# Patient Record
Sex: Female | Born: 1970 | Race: Black or African American | Hispanic: No | Marital: Married | Smoking: Never smoker
Health system: Southern US, Community
[De-identification: ages and names within clinical notes are randomized; demographics above are authoritative.]

---

## 1990-05-08 HISTORY — PX: CHOLECYSTECTOMY: SHX55

## 1997-12-02 ENCOUNTER — Other Ambulatory Visit: Admission: RE | Admit: 1997-12-02 | Discharge: 1997-12-02 | Payer: Self-pay | Admitting: Family Medicine

## 1999-12-23 ENCOUNTER — Other Ambulatory Visit: Admission: RE | Admit: 1999-12-23 | Discharge: 1999-12-23 | Payer: Self-pay | Admitting: Family Medicine

## 2003-07-23 ENCOUNTER — Other Ambulatory Visit: Admission: RE | Admit: 2003-07-23 | Discharge: 2003-07-23 | Payer: Self-pay | Admitting: Family Medicine

## 2004-04-14 ENCOUNTER — Emergency Department (HOSPITAL_COMMUNITY): Admission: EM | Admit: 2004-04-14 | Discharge: 2004-04-14 | Payer: Self-pay | Admitting: Emergency Medicine
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
## 2004-09-01 ENCOUNTER — Other Ambulatory Visit: Admission: RE | Admit: 2004-09-01 | Discharge: 2004-09-01 | Payer: Self-pay | Admitting: Family Medicine

## 2006-05-10 ENCOUNTER — Other Ambulatory Visit: Admission: RE | Admit: 2006-05-10 | Discharge: 2006-05-10 | Payer: Self-pay | Admitting: Family Medicine

## 2006-09-22 ENCOUNTER — Emergency Department (HOSPITAL_COMMUNITY): Admission: EM | Admit: 2006-09-22 | Discharge: 2006-09-23 | Payer: Self-pay | Admitting: Emergency Medicine

## 2007-08-01 ENCOUNTER — Other Ambulatory Visit: Admission: RE | Admit: 2007-08-01 | Discharge: 2007-08-01 | Payer: Self-pay | Admitting: Family Medicine

## 2008-08-11 ENCOUNTER — Other Ambulatory Visit: Admission: RE | Admit: 2008-08-11 | Discharge: 2008-08-11 | Payer: Self-pay | Admitting: Family Medicine

## 2009-05-08 HISTORY — PX: KNEE CARTILAGE SURGERY: SHX688

## 2009-08-16 ENCOUNTER — Other Ambulatory Visit: Admission: RE | Admit: 2009-08-16 | Discharge: 2009-08-16 | Payer: Self-pay | Admitting: Family Medicine

## 2010-08-25 ENCOUNTER — Other Ambulatory Visit: Payer: Self-pay | Admitting: Physician Assistant

## 2010-08-25 ENCOUNTER — Other Ambulatory Visit (HOSPITAL_COMMUNITY)
Admission: RE | Admit: 2010-08-25 | Discharge: 2010-08-25 | Disposition: A | Discharge status: Home / Self Care | Payer: BC Managed Care – PPO | Source: Ambulatory Visit | Attending: Family Medicine | Admitting: Family Medicine

## 2010-08-25 DIAGNOSIS — Z01419 Encounter for gynecological examination (general) (routine) without abnormal findings: Secondary | ICD-10-CM | POA: Insufficient documentation

## 2010-12-13 ENCOUNTER — Ambulatory Visit (HOSPITAL_BASED_OUTPATIENT_CLINIC_OR_DEPARTMENT_OTHER)
Admission: RE | Admit: 2010-12-13 | Discharge: 2010-12-13 | Disposition: A | Discharge status: Home / Self Care | Payer: BC Managed Care – PPO | Source: Ambulatory Visit | Attending: Orthopaedic Surgery | Admitting: Orthopaedic Surgery

## 2010-12-13 DIAGNOSIS — Z01812 Encounter for preprocedural laboratory examination: Secondary | ICD-10-CM | POA: Insufficient documentation

## 2010-12-13 DIAGNOSIS — M2240 Chondromalacia patellae, unspecified knee: Secondary | ICD-10-CM | POA: Insufficient documentation

## 2010-12-13 DIAGNOSIS — M23329 Other meniscus derangements, posterior horn of medial meniscus, unspecified knee: Secondary | ICD-10-CM | POA: Insufficient documentation

## 2010-12-21 NOTE — Op Note (Signed)
  Destiny Andrews, Destiny Andrews                 ACCOUNT NO.:  [id number]  MEDICAL RECORD NO.:  [id number]  LOCATION:                                 FACILITY:  PHYSICIAN:  Lubertha Basque. Constanza Mincy, M.D.DATE OF BIRTH:  Oct 22, 1970  DATE OF PROCEDURE:  12/13/2010 DATE OF DISCHARGE:                              OPERATIVE REPORT   PREOPERATIVE DIAGNOSES: 1. Left knee torn medial meniscus. 2. Left knee chondromalacia patella.  POSTOPERATIVE DIAGNOSES: 1. Left knee torn medial meniscus. 2. Left knee chondromalacia patella.  PROCEDURES: 1. Left knee partial medial meniscectomy. 2. Left knee abrasion chondroplasty of patellofemoral.  ANESTHESIA:  General.  ATTENDING SURGEON:  Lubertha Basque. Jerl Santos, MD  ASSISTANT:  Lindwood Qua, PA   INDICATIONS FOR PROCEDURE:  The patient is a 40 year old woman with many months of left knee pain.  This has persisted despite conservative measures.  She has an MRI scan which shows a fairly large radial tear at the posterior horn of the medial meniscus.  She is offered arthroscopy in hopes of improving her situation.  Informed operative consent was obtained after discussion of possible complications including reaction to anesthesia and infection.  SUMMARY OF FINDINGS AND PROCEDURE:  Under general anesthesia, an arthroscopy of the left knee was performed.  Suprapatellar pouch was benign while the patellofemoral joint exhibited some focal breakdown at the apex of the patella.  A thorough chondroplasty was done along with abrasion to bleeding bone in one tiny area.  Patella did track in a relatively lateral position, but no lateral release was felt to be indicated.  The medial compartment was notable for posterior horn medial meniscus tear, radial in nature out to the periphery.  I contoured this back to stable tissues in both directions.  About 10% partial medial menisectomy was done.  There were minimal degenerative changes in this compartment.  ACL was intact and  the lateral compartment was completely benign.  DESCRIPTION OF PROCEDURE:  The patient was taken to the operating suite where general anesthetic was applied without difficulty.  She was positioned in supine and prepped and draped in normal sterile fashion. After administration of IV Kefzol, an arthroscopy of the left knee was formed through a total of 2 portals.  Findings were as noted above and the procedure consisted of partial medial meniscectomy done with basket and shaver back to stable tissues.  We then performed the abrasion chondroplasty patellofemoral.  The knee was thoroughly irrigated followed by placement of the Marcaine with epinephrine and morphine. Adaptic was placed over the portals followed by dry gauze and a loose Ace wrap.  Estimated blood loss and fluids obtained from anesthesia records.  DISPOSITION:  The patient was explained in the operating room and taken to recovery room in stable condition.  She was to go home the same day and follow up in the office closely.  I will contact her by phone tonight.     Lubertha Basque Jerl Santos, M.D.     PGD/MEDQ  D:  12/13/2010  T:  12/14/2010  Job:  [id number]  Electronically Signed by Marcene Corning M.D. on 12/21/2010 08:59:39 PM

## 2011-08-25 ENCOUNTER — Other Ambulatory Visit: Payer: Self-pay | Admitting: Physician Assistant

## 2011-08-25 ENCOUNTER — Other Ambulatory Visit (HOSPITAL_COMMUNITY)
Admission: RE | Admit: 2011-08-25 | Discharge: 2011-08-25 | Disposition: A | Discharge status: Home / Self Care | Payer: BC Managed Care – PPO | Source: Ambulatory Visit | Attending: Family Medicine | Admitting: Family Medicine

## 2011-08-25 DIAGNOSIS — Z124 Encounter for screening for malignant neoplasm of cervix: Secondary | ICD-10-CM | POA: Insufficient documentation

## 2011-08-28 ENCOUNTER — Other Ambulatory Visit: Payer: Self-pay | Admitting: Family Medicine

## 2011-08-28 DIAGNOSIS — Z1231 Encounter for screening mammogram for malignant neoplasm of breast: Secondary | ICD-10-CM

## 2011-08-29 ENCOUNTER — Ambulatory Visit
Admission: RE | Admit: 2011-08-29 | Discharge: 2011-08-29 | Disposition: A | Discharge status: Home / Self Care | Payer: BC Managed Care – PPO | Source: Ambulatory Visit | Attending: Family Medicine | Admitting: Family Medicine

## 2011-08-29 DIAGNOSIS — Z1231 Encounter for screening mammogram for malignant neoplasm of breast: Secondary | ICD-10-CM

## 2012-08-15 ENCOUNTER — Other Ambulatory Visit (HOSPITAL_COMMUNITY)
Admission: RE | Admit: 2012-08-15 | Discharge: 2012-08-15 | Disposition: A | Discharge status: Home / Self Care | Payer: BC Managed Care – PPO | Source: Ambulatory Visit | Attending: Family Medicine | Admitting: Family Medicine

## 2012-08-15 ENCOUNTER — Other Ambulatory Visit: Payer: Self-pay | Admitting: Physician Assistant

## 2012-08-15 DIAGNOSIS — Z124 Encounter for screening for malignant neoplasm of cervix: Secondary | ICD-10-CM | POA: Insufficient documentation

## 2012-08-19 ENCOUNTER — Other Ambulatory Visit: Payer: Self-pay

## 2012-08-19 DIAGNOSIS — Z1231 Encounter for screening mammogram for malignant neoplasm of breast: Secondary | ICD-10-CM

## 2012-09-06 ENCOUNTER — Ambulatory Visit
Admission: RE | Admit: 2012-09-06 | Discharge: 2012-09-06 | Disposition: A | Discharge status: Home / Self Care | Payer: BC Managed Care – PPO | Source: Ambulatory Visit

## 2012-09-06 DIAGNOSIS — Z1231 Encounter for screening mammogram for malignant neoplasm of breast: Secondary | ICD-10-CM

## 2013-08-28 ENCOUNTER — Other Ambulatory Visit: Payer: Self-pay | Admitting: Physician Assistant

## 2013-08-28 ENCOUNTER — Other Ambulatory Visit (HOSPITAL_COMMUNITY)
Admission: RE | Admit: 2013-08-28 | Discharge: 2013-08-28 | Disposition: A | Discharge status: Home / Self Care | Payer: BC Managed Care – PPO | Source: Ambulatory Visit | Attending: Family Medicine | Admitting: Family Medicine

## 2013-08-28 DIAGNOSIS — Z124 Encounter for screening for malignant neoplasm of cervix: Secondary | ICD-10-CM | POA: Insufficient documentation

## 2013-09-09 ENCOUNTER — Other Ambulatory Visit: Payer: Self-pay

## 2013-09-09 DIAGNOSIS — Z1231 Encounter for screening mammogram for malignant neoplasm of breast: Secondary | ICD-10-CM

## 2013-09-17 ENCOUNTER — Ambulatory Visit
Admission: RE | Admit: 2013-09-17 | Discharge: 2013-09-17 | Disposition: A | Discharge status: Home / Self Care | Payer: BC Managed Care – PPO | Source: Ambulatory Visit

## 2013-09-17 ENCOUNTER — Encounter (INDEPENDENT_AMBULATORY_CARE_PROVIDER_SITE_OTHER): Payer: Self-pay

## 2013-09-17 DIAGNOSIS — Z1231 Encounter for screening mammogram for malignant neoplasm of breast: Secondary | ICD-10-CM

## 2014-08-28 ENCOUNTER — Other Ambulatory Visit: Payer: Self-pay

## 2014-08-28 DIAGNOSIS — Z1231 Encounter for screening mammogram for malignant neoplasm of breast: Secondary | ICD-10-CM

## 2014-09-22 ENCOUNTER — Ambulatory Visit: Payer: BC Managed Care – PPO

## 2014-09-25 ENCOUNTER — Ambulatory Visit
Admission: RE | Admit: 2014-09-25 | Discharge: 2014-09-25 | Disposition: A | Discharge status: Home / Self Care | Payer: BC Managed Care – PPO | Source: Ambulatory Visit

## 2014-09-25 ENCOUNTER — Encounter (INDEPENDENT_AMBULATORY_CARE_PROVIDER_SITE_OTHER): Payer: Self-pay

## 2014-09-25 DIAGNOSIS — Z1231 Encounter for screening mammogram for malignant neoplasm of breast: Secondary | ICD-10-CM

## 2015-05-03 ENCOUNTER — Ambulatory Visit (INDEPENDENT_AMBULATORY_CARE_PROVIDER_SITE_OTHER): Payer: BC Managed Care – PPO

## 2015-05-03 ENCOUNTER — Ambulatory Visit (INDEPENDENT_AMBULATORY_CARE_PROVIDER_SITE_OTHER): Payer: BC Managed Care – PPO | Admitting: Podiatry

## 2015-05-03 DIAGNOSIS — M2040 Other hammer toe(s) (acquired), unspecified foot: Secondary | ICD-10-CM | POA: Diagnosis not present

## 2015-05-03 DIAGNOSIS — M79675 Pain in left toe(s): Secondary | ICD-10-CM | POA: Diagnosis not present

## 2015-05-03 NOTE — Progress Notes (Signed)
Subjective:     Patient ID: Destiny Andrews, female   DOB: February 19, 1971, 44 y.o.   MRN: [id number]  HPI patient presents stating I have corns on each of my fifth toes left worse than right and they bother me at time and the color has changed when I put on medication   Review of Systems  All other systems reviewed and are negative.      Objective:   Physical Exam  Constitutional: She is oriented to person, place, and time.  Cardiovascular: Intact distal pulses.   Musculoskeletal: Normal range of motion.  Neurological: She is oriented to person, place, and time.  Skin: Skin is warm.  Nursing note and vitals reviewed.  neurovascular status intact muscle strength adequate range of motion within normal limits with patient noted to have keratotic lesions fifth digits bilateral left over right that are painful when pressed with discoloration on the head of the proximal phalanx fifth toe left. Patient has good digital perfusion and is well oriented 3     Assessment:     Hammertoe deformity fifth digit bilateral with keratotic lesion and moderate discomfort    Plan:     H&P and x-rays reviewed with patient. I discussed possibilities for future arthroplasty or other procedure and at this point I went ahead and I discussed padding therapy which was dispensed. Reappoint as symptoms indicate

## 2015-05-03 NOTE — Progress Notes (Signed)
   Subjective:    Patient ID: Destiny Andrews, female    DOB: 1971-03-09, 44 y.o.   MRN: [id number]  HPI Pt presents with bilateral 5th toe corns, left over right, painful recent complication   Review of Systems  All other systems reviewed and are negative.      Objective:   Physical Exam        Assessment & Plan:

## 2015-05-04 DIAGNOSIS — M2040 Other hammer toe(s) (acquired), unspecified foot: Secondary | ICD-10-CM

## 2015-09-06 ENCOUNTER — Other Ambulatory Visit: Payer: Self-pay | Admitting: Physician Assistant

## 2015-09-06 ENCOUNTER — Other Ambulatory Visit (HOSPITAL_COMMUNITY)
Admission: RE | Admit: 2015-09-06 | Discharge: 2015-09-06 | Disposition: A | Discharge status: Home / Self Care | Payer: BC Managed Care – PPO | Source: Ambulatory Visit | Attending: Family Medicine | Admitting: Family Medicine

## 2015-09-06 DIAGNOSIS — Z124 Encounter for screening for malignant neoplasm of cervix: Secondary | ICD-10-CM | POA: Insufficient documentation

## 2015-09-07 LAB — CYTOLOGY - PAP

## 2015-09-21 ENCOUNTER — Other Ambulatory Visit: Payer: Self-pay

## 2015-09-21 DIAGNOSIS — Z1231 Encounter for screening mammogram for malignant neoplasm of breast: Secondary | ICD-10-CM

## 2015-09-27 ENCOUNTER — Ambulatory Visit
Admission: RE | Admit: 2015-09-27 | Discharge: 2015-09-27 | Disposition: A | Discharge status: Home / Self Care | Payer: BC Managed Care – PPO | Source: Ambulatory Visit

## 2015-09-27 DIAGNOSIS — Z1231 Encounter for screening mammogram for malignant neoplasm of breast: Secondary | ICD-10-CM

## 2016-08-24 ENCOUNTER — Other Ambulatory Visit: Payer: Self-pay | Admitting: Physician Assistant

## 2016-08-24 DIAGNOSIS — Z1231 Encounter for screening mammogram for malignant neoplasm of breast: Secondary | ICD-10-CM

## 2016-09-27 ENCOUNTER — Inpatient Hospital Stay: Admission: RE | Admit: 2016-09-27 | Payer: BC Managed Care – PPO | Source: Ambulatory Visit

## 2016-10-11 ENCOUNTER — Ambulatory Visit
Admission: RE | Admit: 2016-10-11 | Discharge: 2016-10-11 | Disposition: A | Discharge status: Home / Self Care | Payer: BC Managed Care – PPO | Source: Ambulatory Visit | Attending: Physician Assistant | Admitting: Physician Assistant

## 2016-10-11 DIAGNOSIS — Z1231 Encounter for screening mammogram for malignant neoplasm of breast: Secondary | ICD-10-CM

## 2017-05-08 DIAGNOSIS — I10 Essential (primary) hypertension: Secondary | ICD-10-CM

## 2017-05-08 DIAGNOSIS — E119 Type 2 diabetes mellitus without complications: Secondary | ICD-10-CM

## 2017-05-08 HISTORY — DX: Type 2 diabetes mellitus without complications: E11.9

## 2017-05-08 HISTORY — DX: Essential (primary) hypertension: I10

## 2017-09-04 ENCOUNTER — Other Ambulatory Visit: Payer: Self-pay | Admitting: Physician Assistant

## 2017-09-04 DIAGNOSIS — Z1231 Encounter for screening mammogram for malignant neoplasm of breast: Secondary | ICD-10-CM

## 2017-10-12 ENCOUNTER — Ambulatory Visit
Admission: RE | Admit: 2017-10-12 | Discharge: 2017-10-12 | Disposition: A | Discharge status: Home / Self Care | Payer: BC Managed Care – PPO | Source: Ambulatory Visit | Attending: Physician Assistant | Admitting: Physician Assistant

## 2017-10-12 DIAGNOSIS — Z1231 Encounter for screening mammogram for malignant neoplasm of breast: Secondary | ICD-10-CM

## 2018-09-04 ENCOUNTER — Other Ambulatory Visit: Payer: Self-pay | Admitting: Physician Assistant

## 2018-09-04 DIAGNOSIS — Z1231 Encounter for screening mammogram for malignant neoplasm of breast: Secondary | ICD-10-CM

## 2018-11-04 ENCOUNTER — Other Ambulatory Visit: Payer: Self-pay

## 2018-11-04 ENCOUNTER — Ambulatory Visit
Admission: RE | Admit: 2018-11-04 | Discharge: 2018-11-04 | Disposition: A | Discharge status: Home / Self Care | Payer: BC Managed Care – PPO | Source: Ambulatory Visit | Attending: Physician Assistant | Admitting: Physician Assistant

## 2018-11-04 DIAGNOSIS — Z1231 Encounter for screening mammogram for malignant neoplasm of breast: Secondary | ICD-10-CM

## 2018-11-04 IMAGING — MG DIGITAL SCREENING BILATERAL MAMMOGRAM WITH TOMO AND CAD
8 series · 8 of 24 positions shown · non-contrast
Comparison: Previous exam(s).

CLINICAL DATA: Screening.

EXAM:
DIGITAL SCREENING BILATERAL MAMMOGRAM WITH TOMO AND CAD

[L MLO synth-2D]
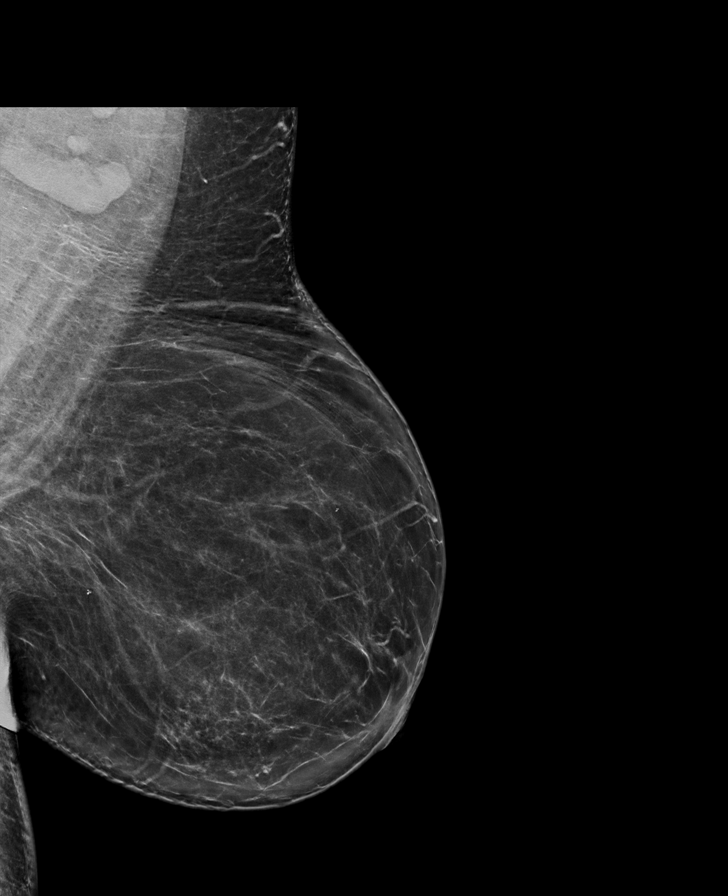

[R CC synth-2D]
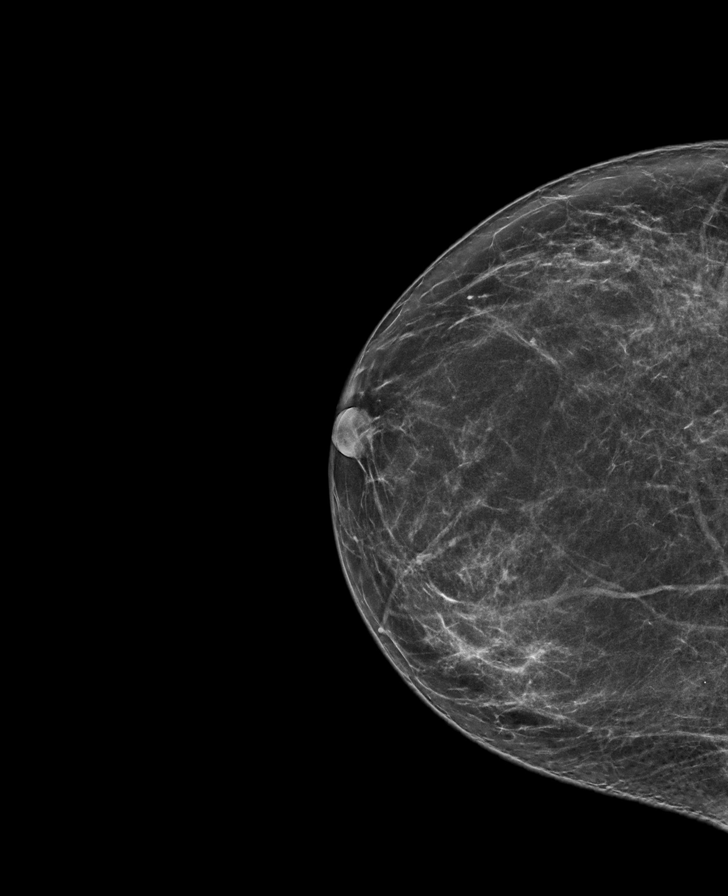

[R MLO synth-2D]
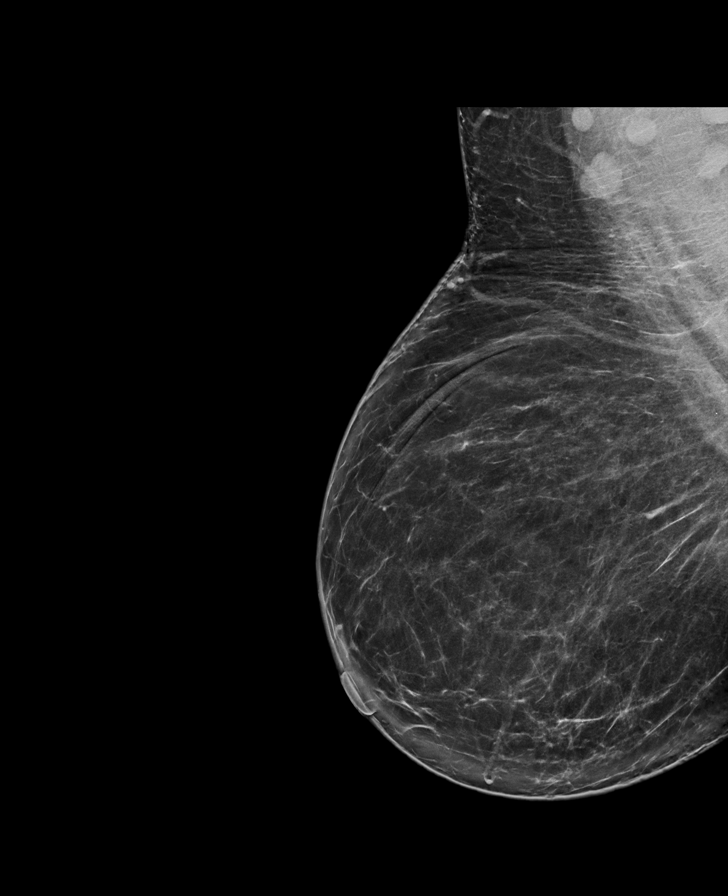

[L CC synth-2D]
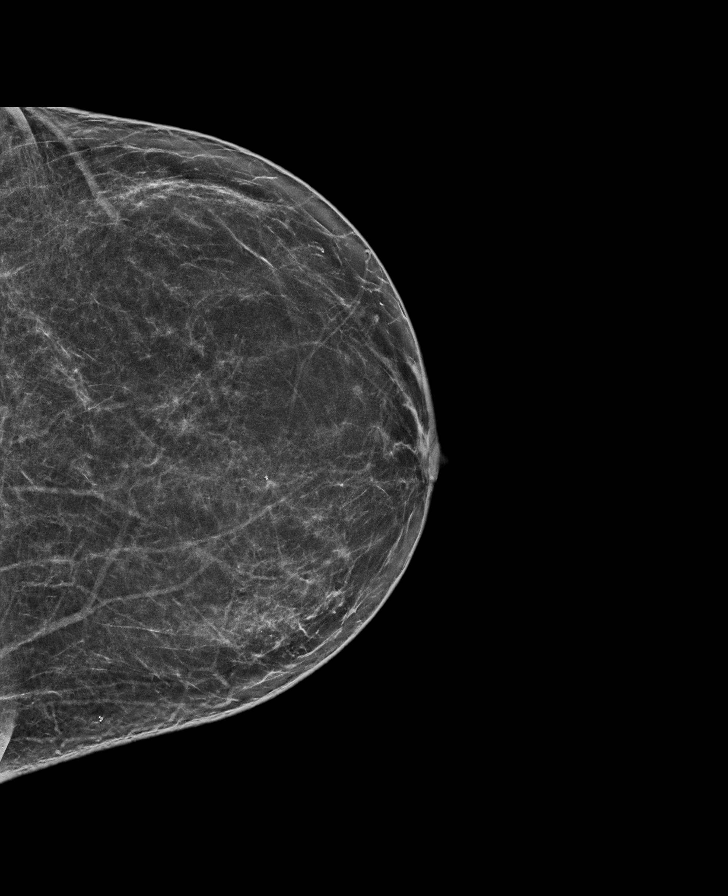

[R CC tomo · tomo slice 35/69.0]
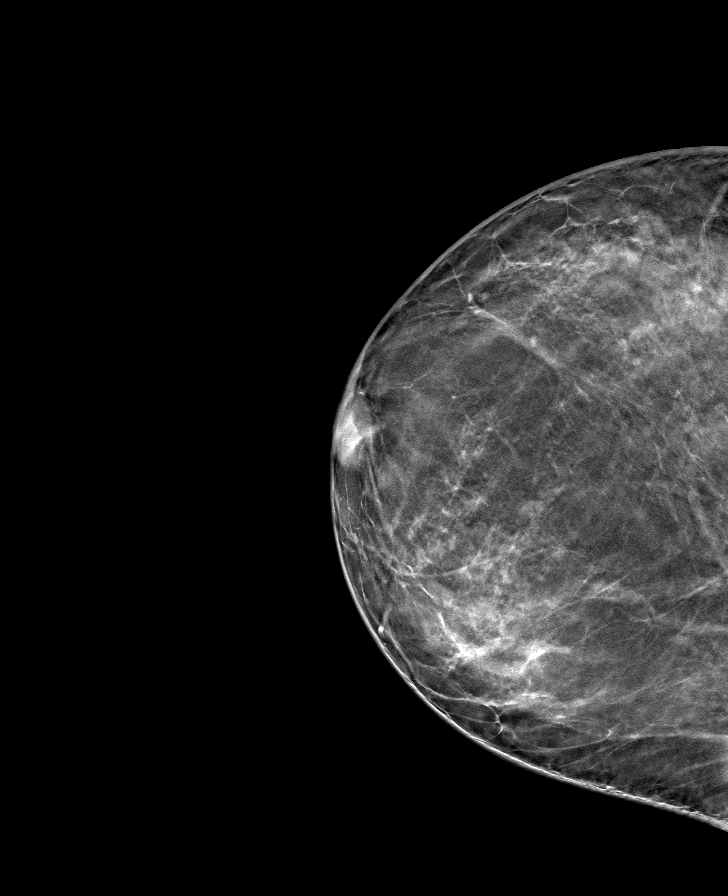

[L CC tomo · tomo slice 35/68.0]
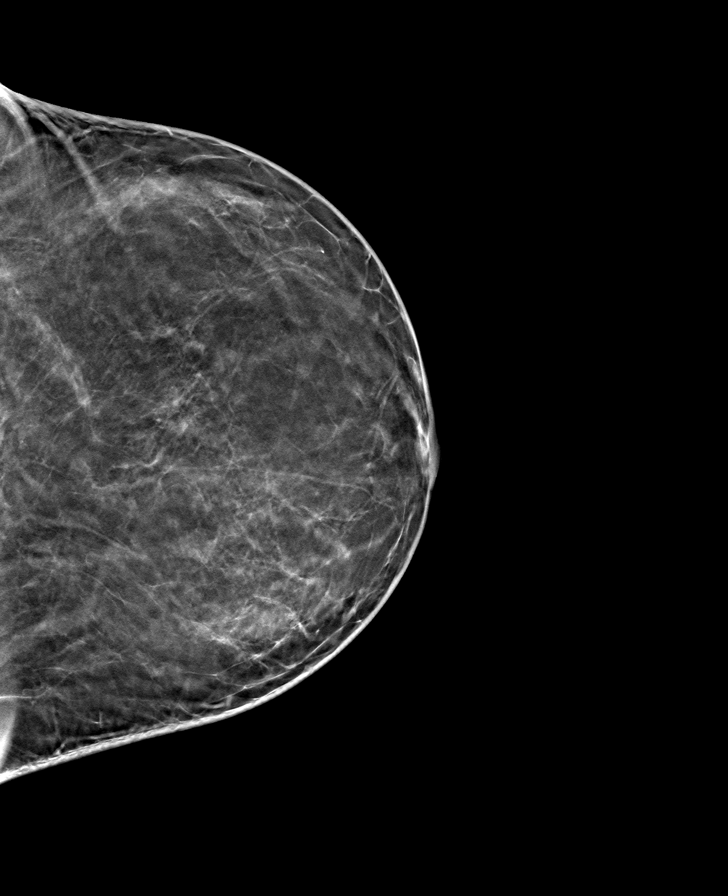

[L MLO tomo · tomo slice 47/93.0]
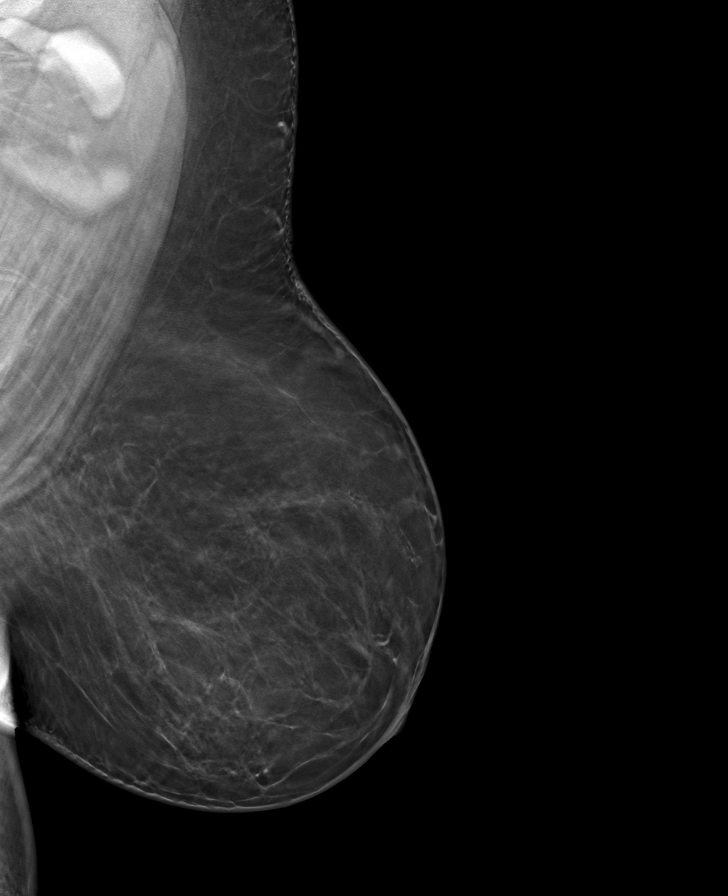

[R MLO tomo · tomo slice 45/90.0]
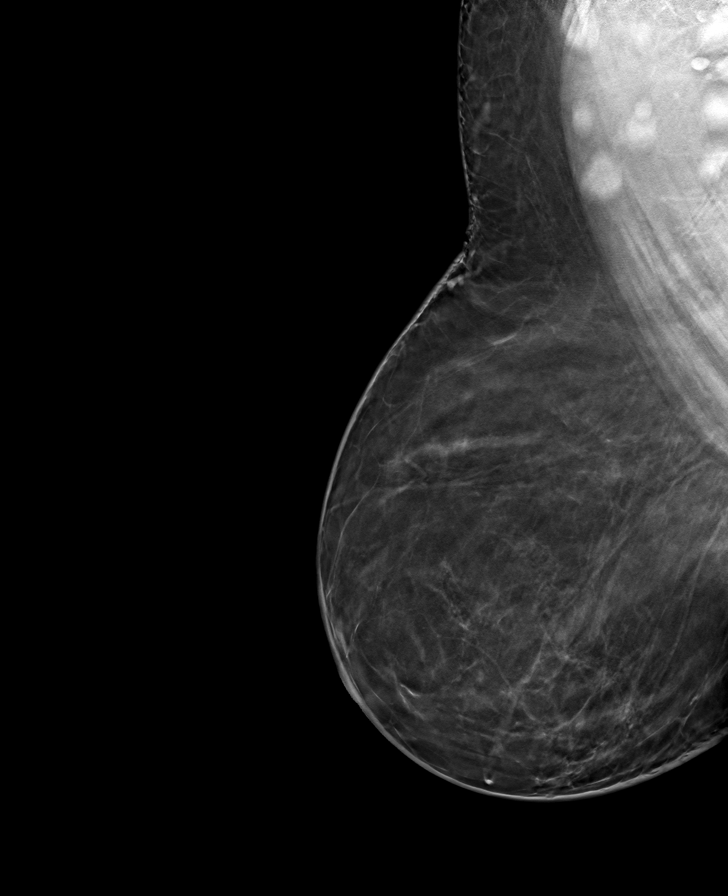

[8 of 24 positions shown; findings below may reference images not displayed]

ACR Breast Density Category b: There are scattered areas of
fibroglandular density.
FINDINGS: There are no findings suspicious for malignancy. Images were
processed with CAD.
IMPRESSION: No mammographic evidence of malignancy. A result letter of this
screening mammogram will be mailed directly to the patient.

RECOMMENDATION:
Screening mammogram in one year. (Code:[TQ])

BI-RADS CATEGORY  1: Negative.

## 2019-04-16 ENCOUNTER — Other Ambulatory Visit (HOSPITAL_COMMUNITY)
Admission: RE | Admit: 2019-04-16 | Discharge: 2019-04-16 | Disposition: A | Discharge status: Home / Self Care | Payer: BC Managed Care – PPO | Source: Ambulatory Visit | Attending: Physician Assistant | Admitting: Physician Assistant

## 2019-04-16 ENCOUNTER — Other Ambulatory Visit: Payer: Self-pay | Admitting: Physician Assistant

## 2019-04-16 DIAGNOSIS — Z124 Encounter for screening for malignant neoplasm of cervix: Secondary | ICD-10-CM | POA: Insufficient documentation

## 2019-04-17 ENCOUNTER — Other Ambulatory Visit: Payer: Self-pay | Admitting: Physician Assistant

## 2019-04-17 DIAGNOSIS — N852 Hypertrophy of uterus: Secondary | ICD-10-CM

## 2019-04-18 LAB — CYTOLOGY - PAP
Adequacy: ABSENT
Diagnosis: NEGATIVE

## 2019-04-28 ENCOUNTER — Ambulatory Visit
Admission: RE | Admit: 2019-04-28 | Discharge: 2019-04-28 | Disposition: A | Discharge status: Home / Self Care | Payer: BC Managed Care – PPO | Source: Ambulatory Visit | Attending: Physician Assistant | Admitting: Physician Assistant

## 2019-04-28 DIAGNOSIS — N852 Hypertrophy of uterus: Secondary | ICD-10-CM

## 2019-04-28 IMAGING — US US PELVIS COMPLETE WITH TRANSVAGINAL
1 series · 13 of 25 positions shown · non-contrast
Comparison: None

CLINICAL DATA: Enlarged uterus on exam; LMP [DATE]

EXAM:
TRANSABDOMINAL AND TRANSVAGINAL ULTRASOUND OF PELVIS
TECHNIQUE: Both transabdominal and transvaginal ultrasound examinations of the
pelvis were performed. Transabdominal technique was performed for
global imaging of the pelvis including uterus, ovaries, adnexal
regions, and pelvic cul-de-sac. It was necessary to proceed with
endovaginal exam following the transabdominal exam to visualize the
endometrium and ovaries.

[Series 1: us pelvis complete with transvaginal · 0.34mm/px · 13 of 122 slices shown]
[im 1/122]
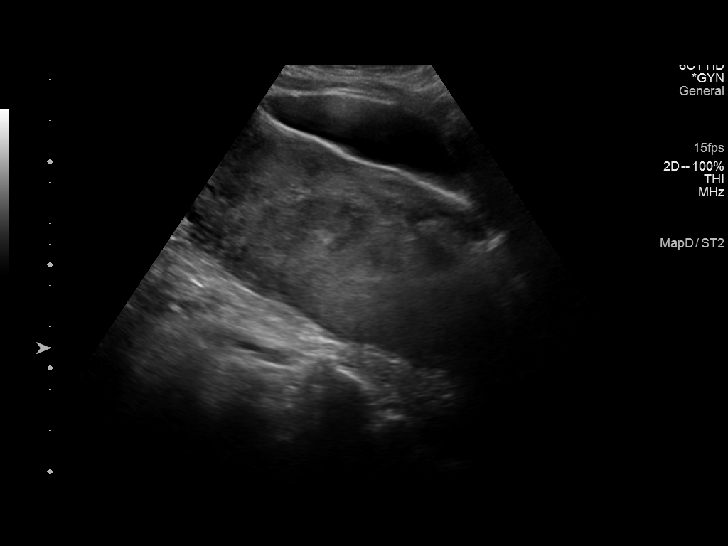
[im 11/122]
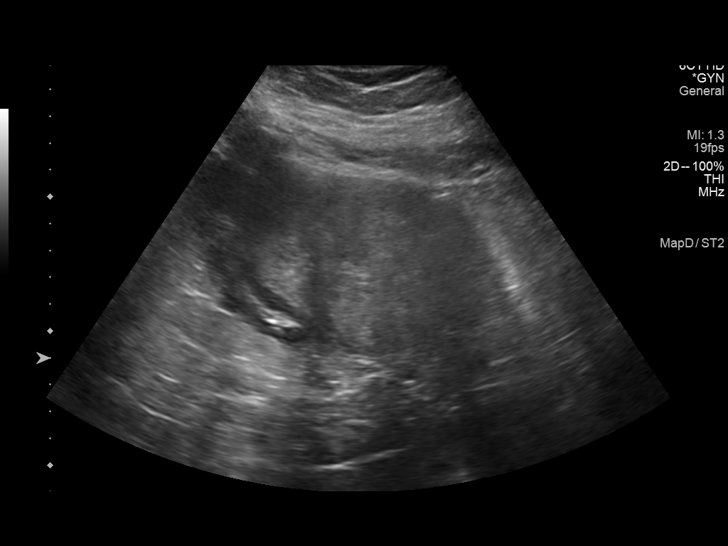
[im 21/122]
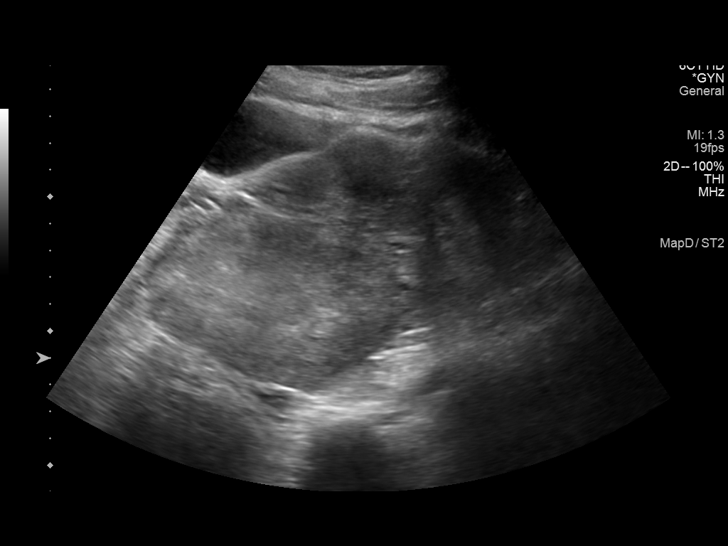
[im 31/122]
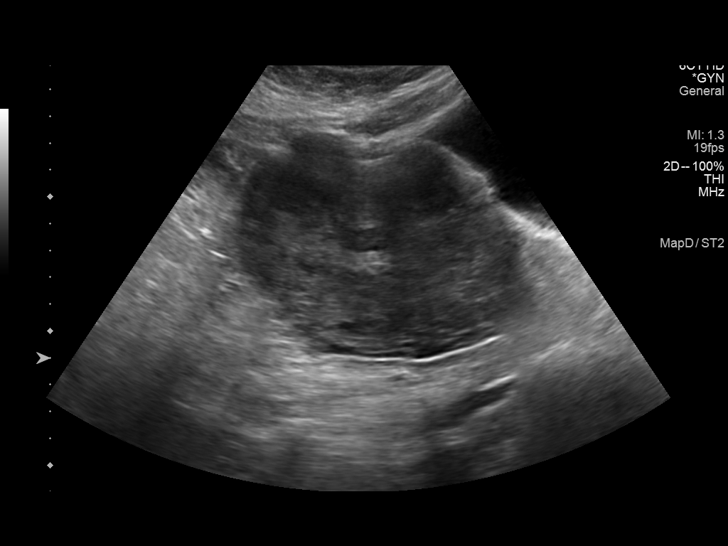
[im 41/122]
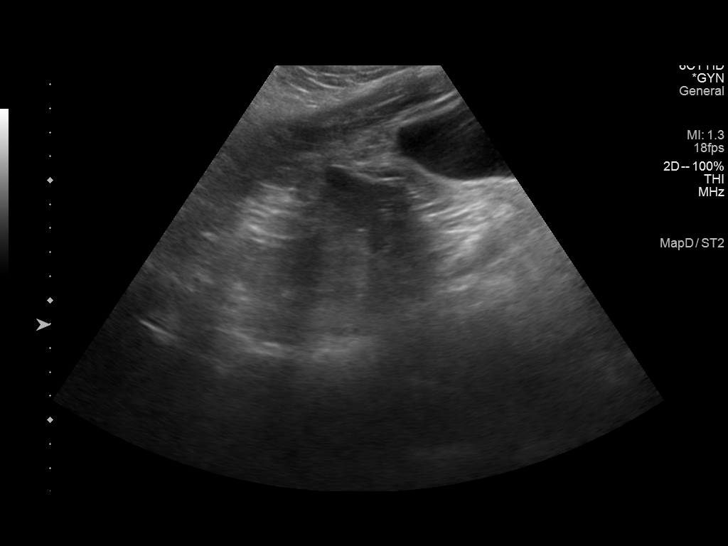
[im 51/122]
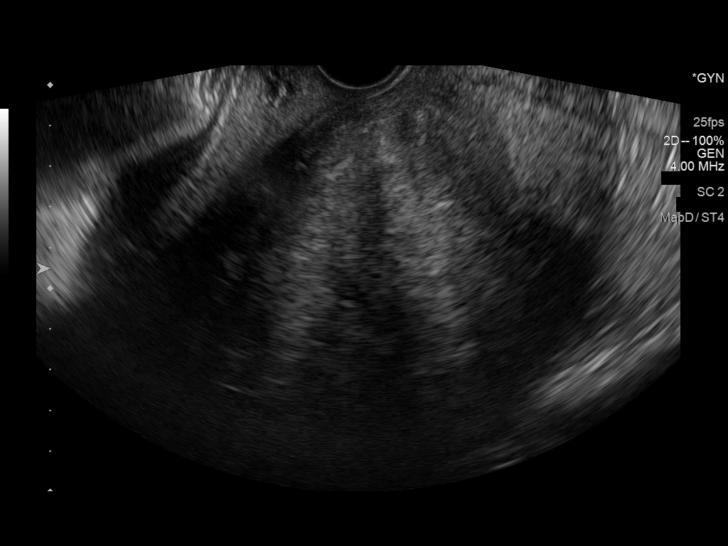
[im 61/122]
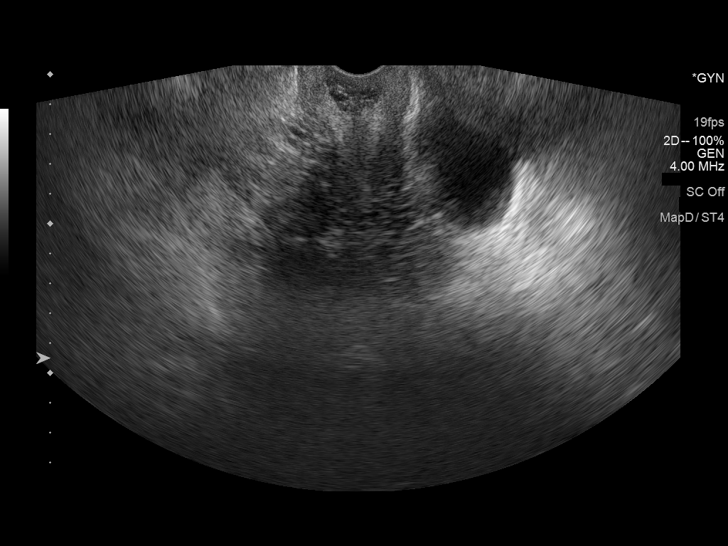
[im 71/122]
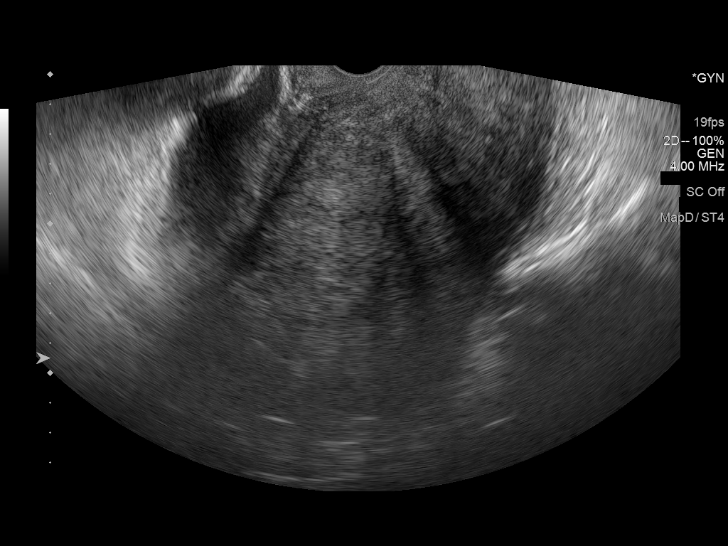
[im 81/122]
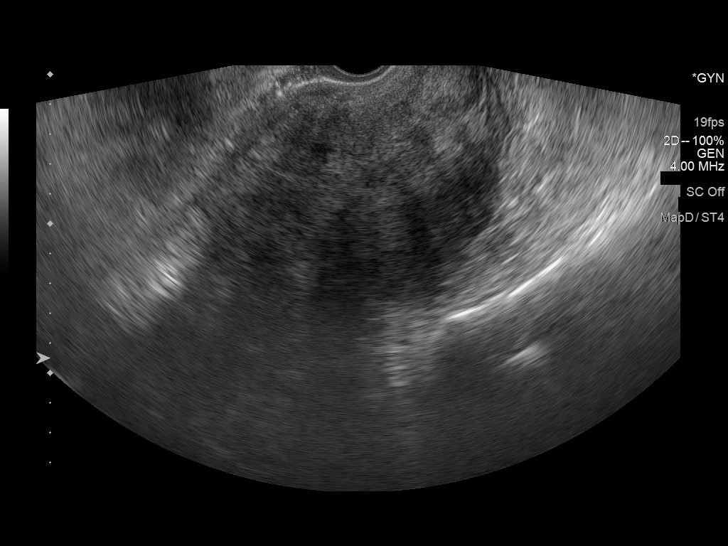
[im 91/122]
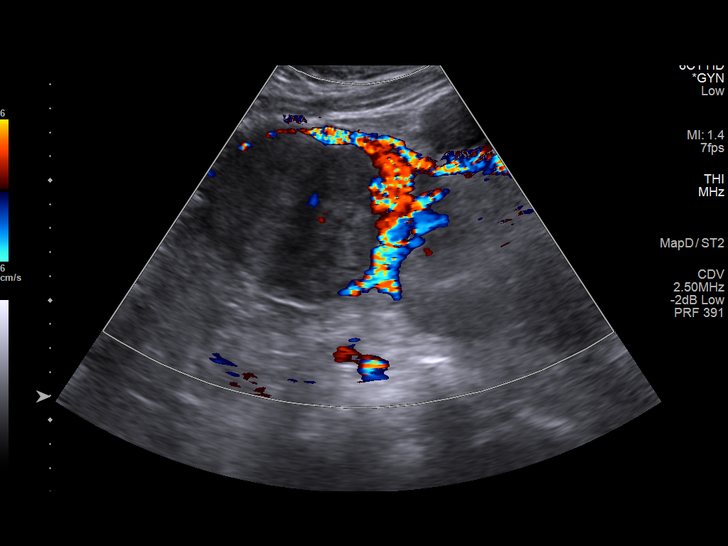
[im 101/122]
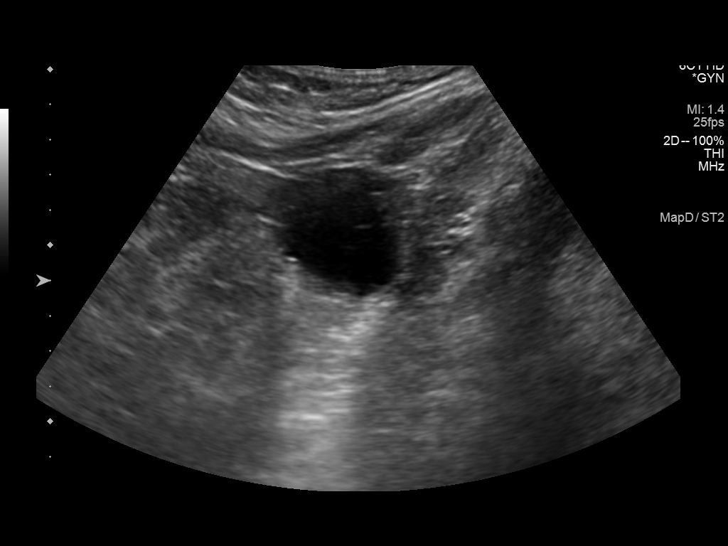
[im 111/122]
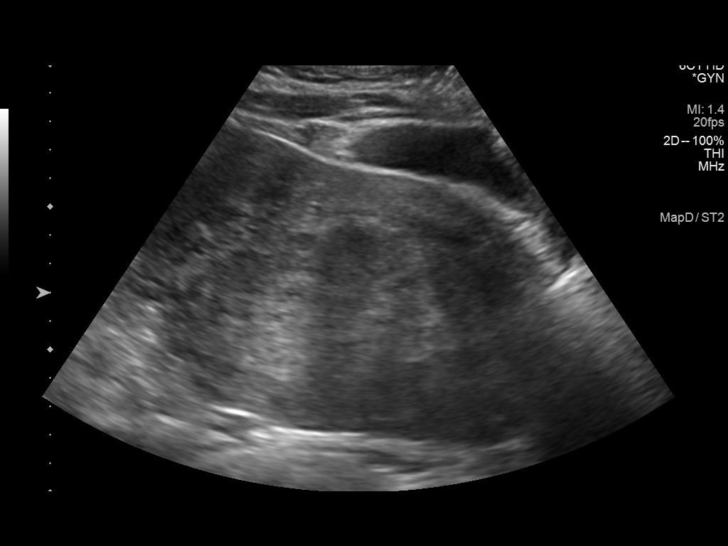
[im 122/122]
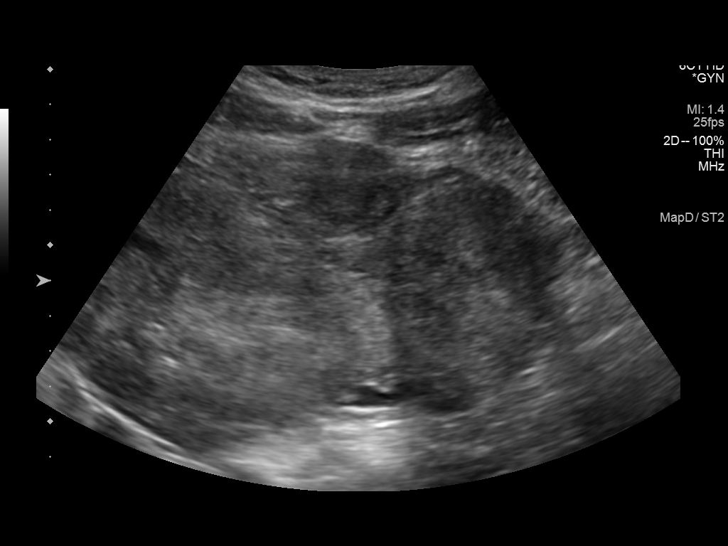

[13 of 25 positions shown; findings below may reference images not displayed]

FINDINGS: Uterus

Measurements: 16.5 x 7.9 x 11.1 cm = volume: 751 mL. Poor sound
transmission through uterus. Enlarged and multinodular containing
multiple leiomyomata. Large posterior leiomyoma 9.7 x 6.7 x 9.5 cm,
extends submucosal. Additional smaller leiomyomata are identified
exophytic anteriorly at the upper RIGHT uterus 3.3 x 3.8 x 3.2 cm
and subserosal at the LEFT anterior fundus 3.1 x 3.2 x 2.9 cm.

Endometrium

Thickness: 10 mm. Suboptimally visualized due to distortion by
uterine leiomyomata, no gross endometrial fluid or mass identified.

Right ovary

Measurements: 4.1 x 3.8 x 3.5 cm = volume: 28.5 mL. Simple cyst
within RIGHT ovary 3.1 x 4.0 x 3.3 cm.

Left ovary

Not visualized on either transabdominal or endovaginal imaging,
likely obscured by bowel

Other findings

No free pelvic fluid.  No adnexal masses.
IMPRESSION: Significantly enlarged multinodular uterus as above.

A large posterior leiomyoma 9.7 cm in greatest size extends
submucosal.

4.0 cm diameter simple cyst RIGHT ovary.

## 2019-07-17 ENCOUNTER — Ambulatory Visit: Payer: BC Managed Care – PPO | Attending: Family

## 2019-07-17 DIAGNOSIS — Z23 Encounter for immunization: Secondary | ICD-10-CM

## 2019-07-17 NOTE — Progress Notes (Signed)
   Covid-19 Vaccination Clinic  Name:  Destiny Andrews    MRN: [id number] DOB: 08-02-1970  07/17/2019  [name] was observed post Covid-19 immunization for 15 minutes without incident. She was provided with Vaccine Information Sheet and instruction to access the V-Safe system.   [name] was instructed to call 911 with any severe reactions post vaccine: Marland Kitchen Difficulty breathing  . Swelling of face and throat  . A fast heartbeat  . A bad rash all over body  . Dizziness and weakness   Immunizations Administered    Name Date Dose VIS Date Route   Moderna COVID-19 Vaccine 07/17/2019 10:40 AM 0.5 mL 04/08/2019 Intramuscular   Manufacturer: Moderna   Lot: [id number]   [id number]

## 2019-07-25 ENCOUNTER — Other Ambulatory Visit: Payer: Self-pay | Admitting: Obstetrics and Gynecology

## 2019-07-25 DIAGNOSIS — D259 Leiomyoma of uterus, unspecified: Secondary | ICD-10-CM

## 2019-08-05 ENCOUNTER — Ambulatory Visit
Admission: RE | Admit: 2019-08-05 | Discharge: 2019-08-05 | Disposition: A | Discharge status: Home / Self Care | Payer: BC Managed Care – PPO | Source: Ambulatory Visit | Attending: Obstetrics and Gynecology | Admitting: Obstetrics and Gynecology

## 2019-08-05 ENCOUNTER — Encounter: Payer: Self-pay | Admitting: *Deleted

## 2019-08-05 ENCOUNTER — Other Ambulatory Visit: Payer: Self-pay

## 2019-08-05 DIAGNOSIS — D259 Leiomyoma of uterus, unspecified: Secondary | ICD-10-CM

## 2019-08-05 HISTORY — PX: IR RADIOLOGIST EVAL & MGMT: IMG5224

## 2019-08-05 NOTE — Consult Note (Signed)
Chief Complaint:  Symptomatic enlarging uterine fibroid  Referring Physician(s): Cole,Tara  History of Present Illness: Destiny Andrews is a 49 y.o. female with obesity, elevated cholesterol, and non-insulin-dependent diabetes who presents for uterine fibroid embolization consult.  She is a G0 P0 A0.  No current pregnancy plans.  Patient has been on long-term birth control pills for approximately 30 years.  No menopausal type symptoms.  Her cycle last approximately 4 days without heavy flow, presumed to be related to the long-term birth control pills.  Last menstrual cycle 06/30/2019.  No intraperiod  Bleeding.  She does have associated pelvic pain and cramping as well as urinary pressure and frequency.  No constipation or issues with bowel movement.  No recent fibroid surgery.  No recent GYN infection.  Normal Pap smear 04/16/2019.  Office ultrasound 04/28/2019 demonstrates multifibroid uterus measuring up to 21 cm in length.  No past medical history on file.  No past surgical history on file.  Allergies: Patient has no known allergies.  Medications: Prior to Admission medications   Medication Sig Start Date End Date Taking? Authorizing Provider  benazepril-hydrochlorthiazide (LOTENSIN HCT) 10-12.5 MG tablet Take 1 tablet by mouth daily.    [provider]  norethindrone-ethinyl estradiol (CYCLAFEM,ALYACEN) 0.5/0.75/1-35 MG-MCG tablet Take 1 tablet by mouth daily.    [provider]     No family history on file.  Social History   Socioeconomic History  . Marital status: Unknown    Spouse name: Not on file  . Number of children: Not on file  . Years of education: Not on file  . Highest education level: Not on file  Occupational History  . Not on file  Tobacco Use  . Smoking status: Not on file  Substance and Sexual Activity  . Alcohol use: Not on file  . Drug use: Not on file  . Sexual activity: Not on file  Other Topics Concern  . Not  on file  Social History Narrative  . Not on file   Social Determinants of Health   Financial Resource Strain:   . Difficulty of Paying Living Expenses:   Food Insecurity:   . Worried About Charity fundraiser in the Last Year:   . Arboriculturist in the Last Year:   Transportation Needs:   . Film/video editor (Medical):   Marland Kitchen Lack of Transportation (Non-Medical):   Physical Activity:   . Days of Exercise per Week:   . Minutes of Exercise per Session:   Stress:   . Feeling of Stress :   Social Connections:   . Frequency of Communication with Friends and Family:   . Frequency of Social Gatherings with Friends and Family:   . Attends Religious Services:   . Active Member of Clubs or Organizations:   . Attends Archivist Meetings:   Marland Kitchen Marital Status:     Review of Systems  Review of Systems: A 12 point ROS discussed and pertinent positives are indicated in the HPI above.  All other systems are negative.  Physical Exam No direct physical exam was performed, telehealth visit only today because of Covid pandemic Vital Signs: There were no vitals taken for this visit.  Imaging: No results found.  Labs:  CBC: No results for input(s): WBC, HGB, HCT, PLT in the last 8760 hours.  COAGS: No results for input(s): INR, APTT in the last 8760 hours.  BMP: No results for input(s): NA, K, CL, CO2, GLUCOSE, BUN, CALCIUM,  CREATININE, GFRNONAA, GFRAA in the last 8760 hours.  Invalid input(s): CMP  LIVER FUNCTION TESTS: No results for input(s): BILITOT, AST, ALT, ALKPHOS, PROT, ALBUMIN in the last 8760 hours.   Assessment and Plan:  Continued multifibroid uterus with enlargement now measuring up to 21 cm in length by ultrasound.  Associated main symptoms include pelvic pain, cramping as well as urinary pressure and frequency.  I suspect her menstrual cycle flow is not significantly heavy because of long-term birth control pills.  The uterine fibroid embolization  procedure was reviewed in detail including the procedure, risk, benefits, overnight recovery, expected goals and outcomes.  She understands the outpatient recovery may take up to 7 to 14 days.  All questions and concerns were addressed.  After discussion she would like to proceed with a work-up including pelvic MRI.  This can be scheduled in the next few days.    plan: Scheduled for pelvic MRI without and with contrast to assess fibroid anatomy for embolization.  Thank you for this interesting consult.  I greatly enjoyed meeting Georgina Ormonde and look forward to participating in their care.  A copy of this report was sent to the requesting provider on this date.  Electronically Signed: Greggory Keen 08/05/2019, 2:33 PM   I spent a total of  40 Minutes   in remote  clinical consultation, greater than 50% of which was counseling/coordinating care for enlarging symptomatic fibroids.    Visit type: Audio only (telephone). Audio (no video) only due to patient's lack of internet/smartphone capability. Alternative for in-person consultation at [facility], [address] [place], [place], [place]. This visit type was conducted due to national recommendations for restrictions regarding the COVID-19 Pandemic (e.g. social distancing).  This format is felt to be most appropriate for this patient at this time.  All issues noted in this document were discussed and addressed.

## 2019-08-06 ENCOUNTER — Other Ambulatory Visit: Payer: Self-pay | Admitting: Interventional Radiology

## 2019-08-06 DIAGNOSIS — D250 Submucous leiomyoma of uterus: Secondary | ICD-10-CM

## 2019-08-19 ENCOUNTER — Ambulatory Visit: Payer: BC Managed Care – PPO | Attending: Family

## 2019-08-19 DIAGNOSIS — Z23 Encounter for immunization: Secondary | ICD-10-CM

## 2019-08-19 NOTE — Progress Notes (Signed)
   Covid-19 Vaccination Clinic  Name:  Destiny Andrews    MRN: [id number] DOB: May 24, 1970  08/19/2019  [name] was observed post Covid-19 immunization for 15 minutes without incident. She was provided with Vaccine Information Sheet and instruction to access the V-Safe system.   [name] was instructed to call 911 with any severe reactions post vaccine: Marland Kitchen Difficulty breathing  . Swelling of face and throat  . A fast heartbeat  . A bad rash all over body  . Dizziness and weakness   Immunizations Administered    Name Date Dose VIS Date Route   Moderna COVID-19 Vaccine 08/19/2019 12:20 PM 0.5 mL 04/08/2019 Intramuscular   Manufacturer: Moderna   Lot: [id number]   [id number]

## 2019-08-20 ENCOUNTER — Other Ambulatory Visit: Payer: Self-pay

## 2019-08-20 ENCOUNTER — Ambulatory Visit (HOSPITAL_COMMUNITY)
Admission: RE | Admit: 2019-08-20 | Discharge: 2019-08-20 | Disposition: A | Discharge status: Home / Self Care | Payer: BC Managed Care – PPO | Source: Ambulatory Visit | Attending: Interventional Radiology | Admitting: Interventional Radiology

## 2019-08-20 DIAGNOSIS — D250 Submucous leiomyoma of uterus: Secondary | ICD-10-CM | POA: Diagnosis not present

## 2019-08-20 IMAGING — MR MR PELVIS WO/W CM
9 of 12 series · 33 of 48 positions shown · IV contrast (GADAVIST)
Comparison: Pelvic sonogram [DATE]

CLINICAL DATA: Fibroid uterus. Pre uterine artery embolization.

EXAM:
MRI PELVIS WITHOUT AND WITH CONTRAST
TECHNIQUE: Multiplanar multisequence MR imaging of the pelvis was performed
both before and after administration of intravenous contrast.
CONTRAST:  10mL GADAVIST GADOBUTROL 1 MMOL/ML IV SOLN

[Series 2: T2 · coronal · 6.0mm · 1.60mm/px · 1 of 30 slices shown (1 of 3)]
[im 1/30]
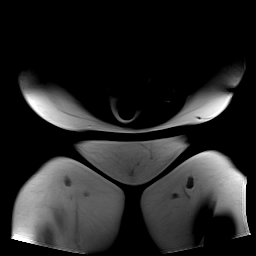

[Series 3: T2 · axial · 5.0mm · 0.51mm/px · z∈[-40,+158]mm · 2 of 34 slices shown (2 of 3)]
[im 1/34]
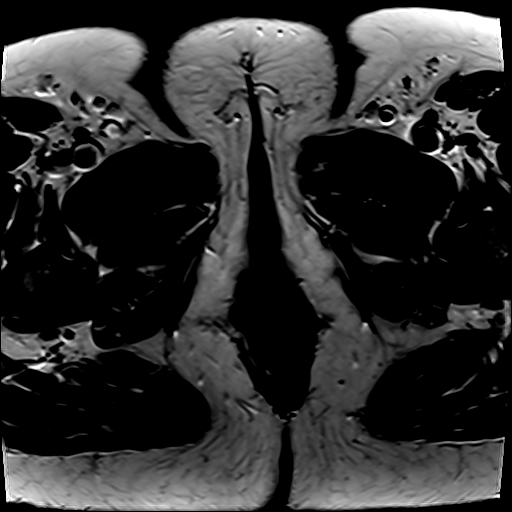
[im 34/34]
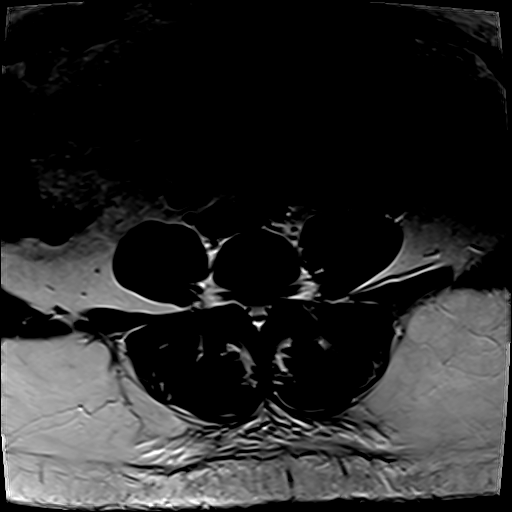

[Series 4: T2 fat-sat · axial · 5.0mm · 0.51mm/px · z∈[-40,+158]mm · 3 of 34 slices shown]
[im 1/34]
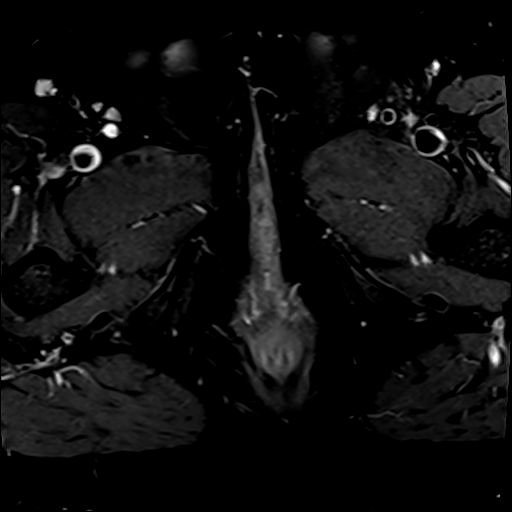
[im 17/34]
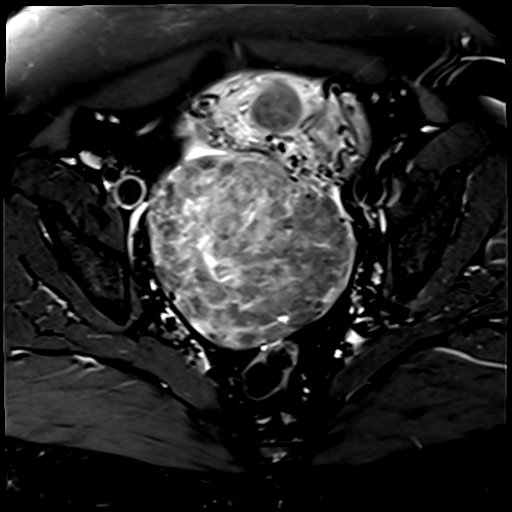
[im 34/34]
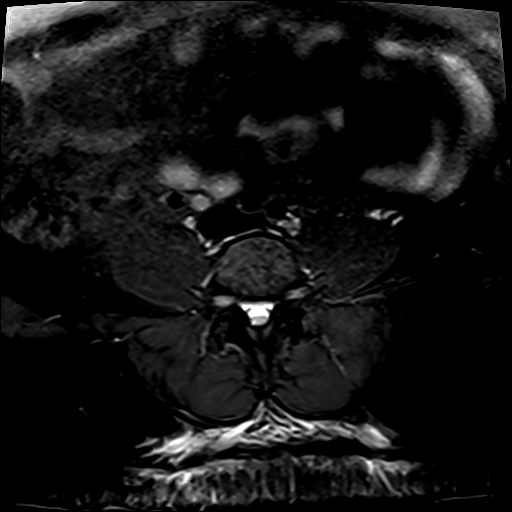

[Series 5: T2 · sagittal · 5.0mm · 0.55mm/px · 3 of 40 slices shown (3 of 3)]
[im 1/40]
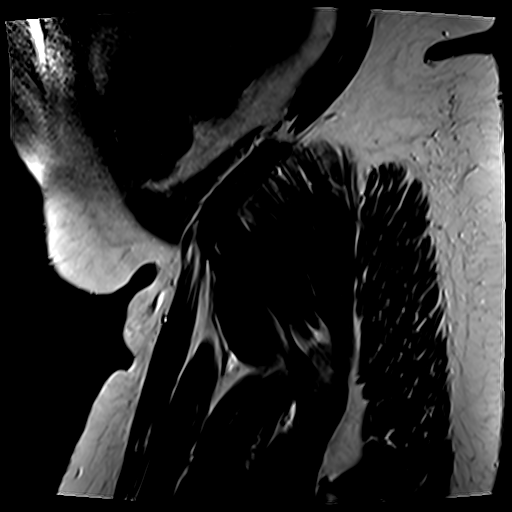
[im 20/40]
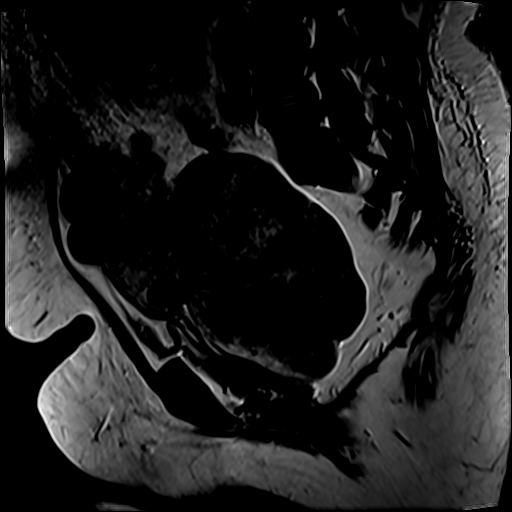
[im 40/40]
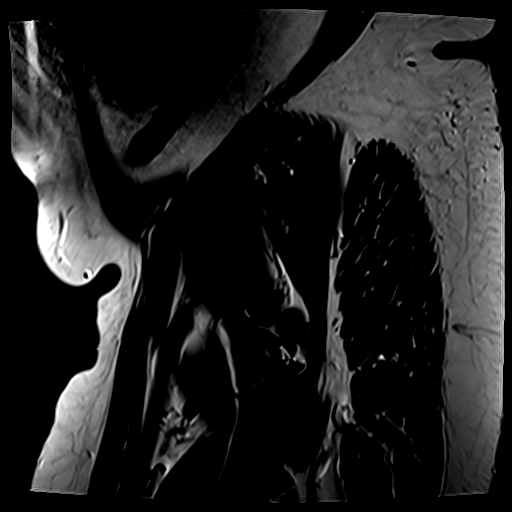

[Series 6: T1 · axial · 5.0mm · 0.51mm/px · z∈[-40,+158]mm · 3 of 34 slices shown]
[im 1/34]
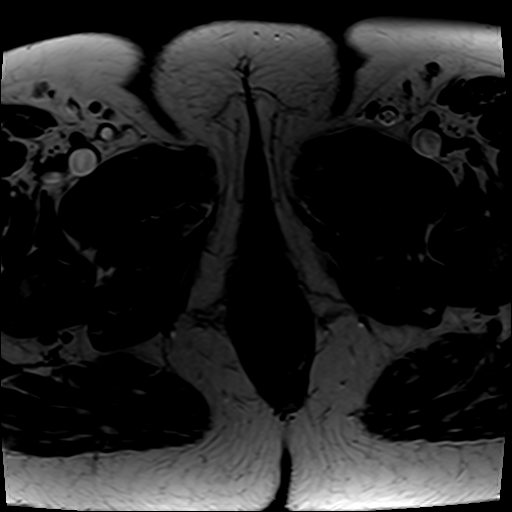
[im 17/34]
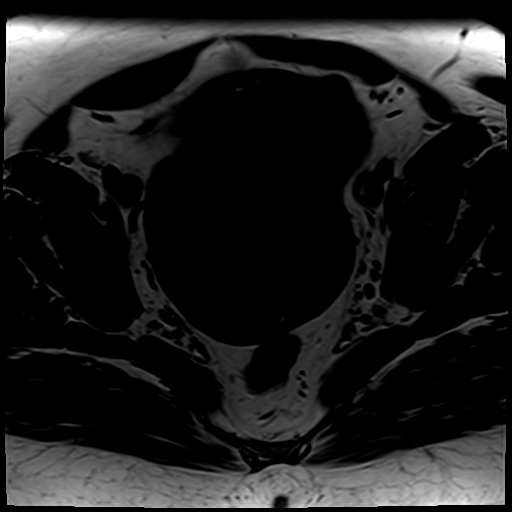
[im 34/34]
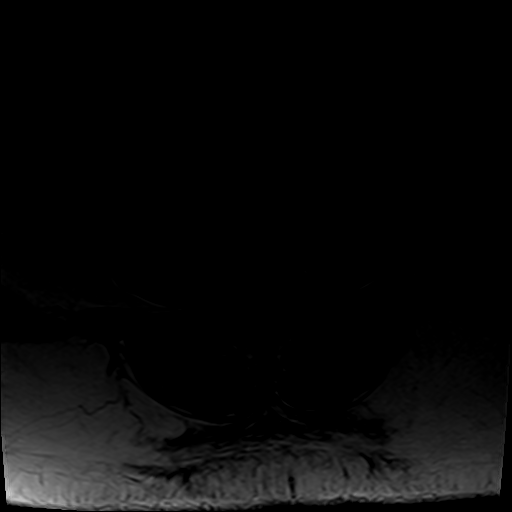

[Series 8: T1 dynamic · axial · 3.0mm · 1.12mm/px · z∈[-59,+178]mm · 6 of 80 slices shown (1 of 4)]
[im 1/80]
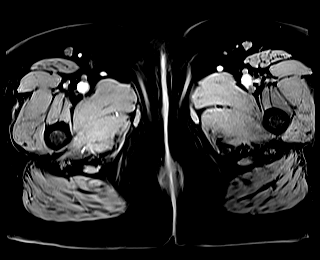
[im 16/80]
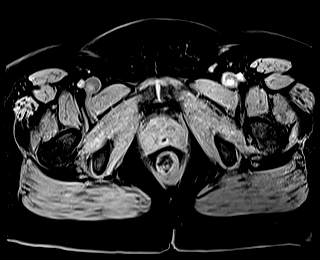
[im 32/80]
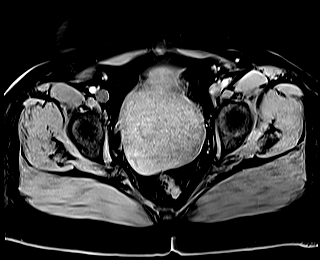
[im 48/80]
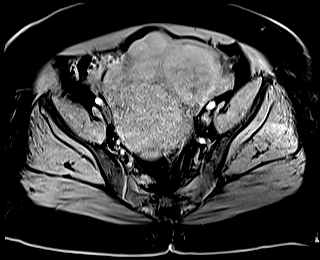
[im 64/80]
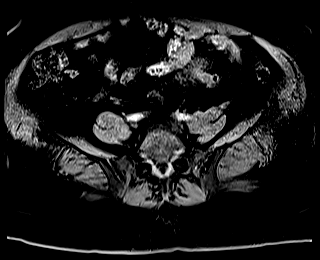
[im 80/80]
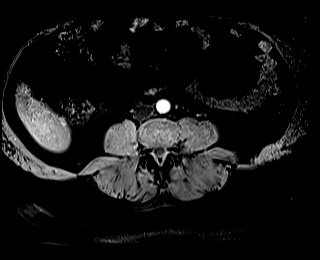

[Series 11: T1 dynamic · axial · 3.0mm · 1.12mm/px · z∈[-59,+178]mm · 6 of 80 slices shown (2 of 4)]
[im 1/80]
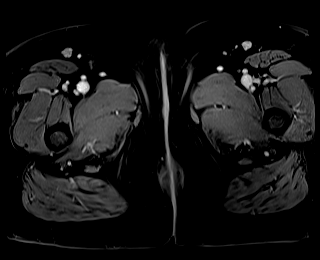
[im 16/80]
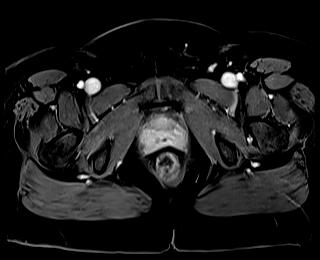
[im 32/80]
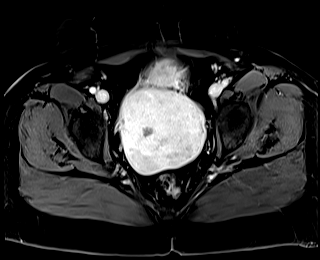
[im 48/80]
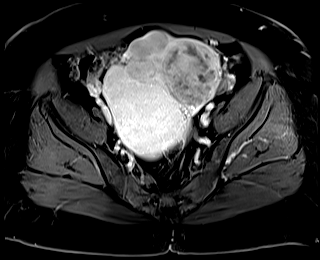
[im 64/80]
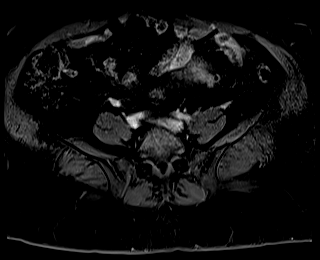
[im 80/80]
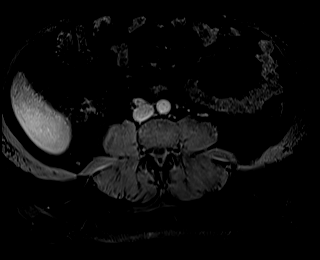

[Series 12: T1 dynamic · axial · 3.0mm · 1.12mm/px · z∈[-59,+178]mm · 6 of 80 slices shown (3 of 4)]
[im 1/80]
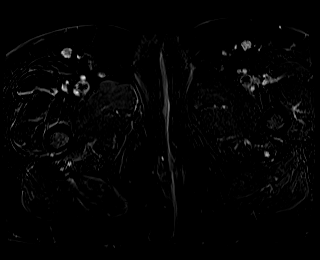
[im 16/80]
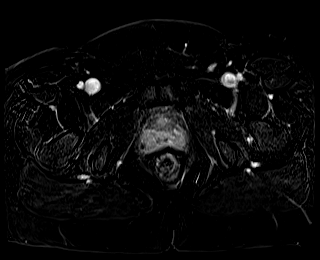
[im 32/80]
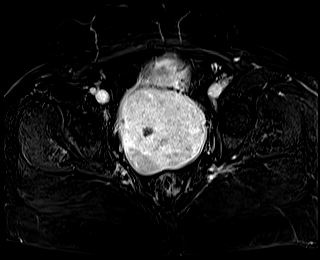
[im 48/80]
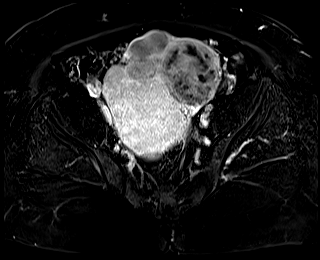
[im 64/80]
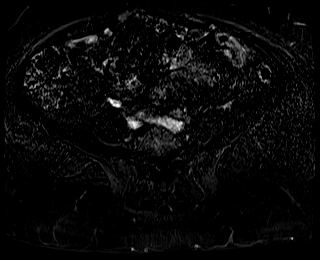
[im 80/80]
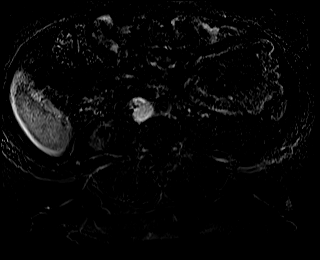

[Series 15: T1 dynamic · axial · 3.0mm · 1.12mm/px · z∈[-59,+34]mm · 3 of 80 slices shown (4 of 4)]
[im 1/80]
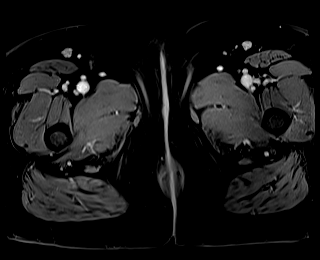
[im 16/80]
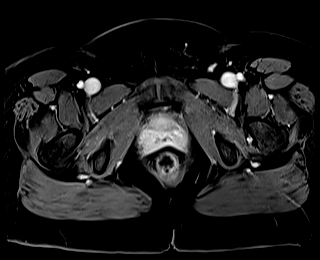
[im 32/80]
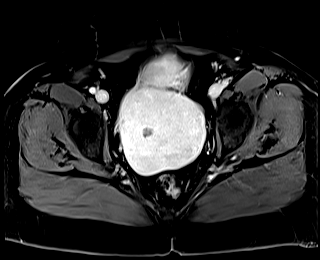

[33 of 48 positions shown; findings below may reference images not displayed]

FINDINGS: Urinary Tract:  Urinary bladder appears collapsed.

Bowel:  Unremarkable visualized pelvic bowel loops.

Vascular/Lymphatic: No pathologically enlarged lymph nodes. No
significant vascular abnormality seen.

Reproductive:

Uterus: Measures 17.7 by 10.3 by 12.6 cm (volume = [94] cm^3).
Multiple uterine fibroids are identified including intramural,
subserosal, and submucosal.

-dominant fibroid is subserosal arising from the posterior
myometrium measuring 12.9 x 8.8 by 10.3 cm. Diffusely enhances
following contrast administration.

-intramural fibroid arising from the left side of fundus measures
7.0 x 6.9 by 7.1 cm. Diffuse heterogeneous enhancement following IV
contrast administration.

-subserosal fibroid arising from the anterior fundus measures 2.8 x
2.7 by 3.4 cm. Diffuse enhancement following contrast
administration.

-possible sub mucosal fibroid within the left side of uterus
measures 2.4 x 2.4 by 2.0 cm. Diffuse enhancement following contrast
administration.

-no large pedunculated fibroids identified

Endometrium: Measures 1.8 mm in thickness.

Right ovary: Measures 3.6 x 1.8 by 1.7 cm. No adnexal mass.

Left ovary: Measures 3.2 x 1.6 by 2.1 cm. No adnexal mass.

Other: Trace free fluid.

Musculoskeletal: No suspicious bone lesions identified.
IMPRESSION: 1. Enlarged uterus secondary to multiple uterine fibroids as
described above.
2. No adnexal mass.
3. Trace free fluid is identified.

## 2019-08-20 MED ORDER — GADOBUTROL 1 MMOL/ML IV SOLN
10.0000 mL | Freq: Once | INTRAVENOUS | Status: AC | PRN
  Administered 2019-08-20: 10 mL via INTRAVENOUS

## 2019-08-29 ENCOUNTER — Other Ambulatory Visit (HOSPITAL_COMMUNITY): Payer: Self-pay | Admitting: Interventional Radiology

## 2019-08-29 DIAGNOSIS — D259 Leiomyoma of uterus, unspecified: Secondary | ICD-10-CM

## 2019-09-29 ENCOUNTER — Other Ambulatory Visit: Payer: Self-pay | Admitting: Physician Assistant

## 2019-09-29 DIAGNOSIS — Z1231 Encounter for screening mammogram for malignant neoplasm of breast: Secondary | ICD-10-CM

## 2019-10-08 ENCOUNTER — Other Ambulatory Visit: Payer: Self-pay | Admitting: Interventional Radiology

## 2019-10-08 DIAGNOSIS — D250 Submucous leiomyoma of uterus: Secondary | ICD-10-CM

## 2019-10-23 ENCOUNTER — Ambulatory Visit
Admission: RE | Admit: 2019-10-23 | Discharge: 2019-10-23 | Disposition: A | Discharge status: Home / Self Care | Payer: BC Managed Care – PPO | Source: Ambulatory Visit | Attending: Interventional Radiology | Admitting: Interventional Radiology

## 2019-10-23 ENCOUNTER — Encounter: Payer: Self-pay | Admitting: *Deleted

## 2019-10-23 ENCOUNTER — Other Ambulatory Visit: Payer: Self-pay

## 2019-10-23 DIAGNOSIS — D250 Submucous leiomyoma of uterus: Secondary | ICD-10-CM

## 2019-10-23 HISTORY — PX: IR RADIOLOGIST EVAL & MGMT: IMG5224

## 2019-10-23 NOTE — Progress Notes (Signed)
Patient ID: Destiny Andrews, female   DOB: Oct 18, 1970, 49 y.o.   MRN: [id number]       Chief Complaint:  Symptomatic uterine fibroids  Referring Physician(s): Christophe Louis  History of Present Illness: Destiny Andrews is a 49 y.o. female with obesity, elevated cholesterol, and non-insulin-dependent diabetes.  She is being evaluated for uterine fibroid embolization therapy versus surgery.  She has a G0, P0 A0.  She remains on long-term oral contraceptives for approximately 30 years.  No current menopausal type symptoms.  After discussing the treatment options with her husband they are uncertain about future pregnancy plans.  Her main symptoms are associated pelvic pain and cramping with urinary pressure and frequency.  Again, I suspect her menstrual cycle is controlled by the long-term birth control pill use.  No significant changes in bowel movement.  No history of prior fibroid surgery or recent GYN infection.  MRI of the pelvis was performed 08/20/2019.  This confirms enlarged fibroid uterus measuring up to 17 cm in length.  Largest posterior subserosal pedunculated fibroid measures 13 cm.  Fibroids continue to enhance.  Anatomy is amenable to embolization therapy.  The visualized pedunculated fibroids have a broad base attachment.  No past medical history on file.  Past Surgical History:  Procedure Laterality Date  . IR RADIOLOGIST EVAL & MGMT  08/05/2019    Allergies: Patient has no known allergies.  Medications: Prior to Admission medications   Medication Sig Start Date End Date Taking? Authorizing Provider  benazepril-hydrochlorthiazide (LOTENSIN HCT) 10-12.5 MG tablet Take 1 tablet by mouth daily.    [provider]  norethindrone-ethinyl estradiol (CYCLAFEM,ALYACEN) 0.5/0.75/1-35 MG-MCG tablet Take 1 tablet by mouth daily.    [provider]     No family history on file.  Social History   Socioeconomic History  . Marital status: Unknown     Spouse name: Not on file  . Number of children: Not on file  . Years of education: Not on file  . Highest education level: Not on file  Occupational History  . Not on file  Tobacco Use  . Smoking status: Not on file  Substance and Sexual Activity  . Alcohol use: Not on file  . Drug use: Not on file  . Sexual activity: Not on file  Other Topics Concern  . Not on file  Social History Narrative  . Not on file   Social Determinants of Health   Financial Resource Strain:   . Difficulty of Paying Living Expenses:   Food Insecurity:   . Worried About Charity fundraiser in the Last Year:   . Arboriculturist in the Last Year:   Transportation Needs:   . Film/video editor (Medical):   Marland Kitchen Lack of Transportation (Non-Medical):   Physical Activity:   . Days of Exercise per Week:   . Minutes of Exercise per Session:   Stress:   . Feeling of Stress :   Social Connections:   . Frequency of Communication with Friends and Family:   . Frequency of Social Gatherings with Friends and Family:   . Attends Religious Services:   . Active Member of Clubs or Organizations:   . Attends Archivist Meetings:   Marland Kitchen Marital Status:     Review of Systems  Review of Systems: A 12 point ROS discussed and pertinent positives are indicated in the HPI above.  All other systems are negative.  Physical Exam No direct physical exam was performed  Telephone health visit only today because of Covid pandemic  Vital Signs: There were no vitals taken for this visit.  Imaging: No results found.  Labs:  CBC: No results for input(s): WBC, HGB, HCT, PLT in the last 8760 hours.  COAGS: No results for input(s): INR, APTT in the last 8760 hours.  BMP: No results for input(s): NA, K, CL, CO2, GLUCOSE, BUN, CALCIUM, CREATININE, GFRNONAA, GFRAA in the last 8760 hours.  Invalid input(s): CMP  LIVER FUNCTION TESTS: No results for input(s): BILITOT, AST, ALT, ALKPHOS, PROT, ALBUMIN in the last  8760 hours.  TUMOR MARKERS: No results for input(s): AFPTM, CEA, CA199, CHROMGRNA in the last 8760 hours.  Assessment and Plan:  Symptomatic multiple uterine fibroids.  Overall fibroid uterus measures up 20 weeks size and is just below the umbilicus by pelvic MRI.  Dominant posterior subserosal pedunculated fibroid has a broad base attachment measuring 13 cm.  Main symptoms include pelvic pain, cramping and urinary pressure and frequency.  Menstrual cycle is controlled by long-term birth control pills.  The fibroid embolization procedure was reviewed again including the options of doing nothing for now, and reconsidering surgical options with Dr. Landry Mellow including myomectomy versus hysterectomy.  All questions and concerns were addressed.  She understands at this large size she may have a somewhat prolonged recovery from embolization up to 2 to 3 weeks with post embolization syndrome including mild fever, myalgias, pelvic pain and discomfort.  Plan: She would like to rediscuss surgical management with Dr. Landry Mellow.  She plans to let us know her decision regarding doing nothing, embolization, or surgical management after that visit.   Electronically Signed: Greggory Keen 10/23/2019, 10:50 AM   I spent a total of    15 Minutes in remote  clinical consultation, greater than 50% of which was counseling/coordinating care for this patient with symptomatic uterine fibroids.    Visit type: Audio only (telephone). Audio (no video) only due to patient's lack of internet/smartphone capability. Alternative for in-person consultation at [facility], [address], [place], [place]. This visit type was conducted due to national recommendations for restrictions regarding the COVID-19 Pandemic (e.g. social distancing).  This format is felt to be most appropriate for this patient at this time.  All issues noted in this document were discussed and addressed.

## 2019-11-05 ENCOUNTER — Other Ambulatory Visit: Payer: Self-pay

## 2019-11-05 ENCOUNTER — Ambulatory Visit: Payer: BC Managed Care – PPO

## 2019-11-05 ENCOUNTER — Ambulatory Visit
Admission: RE | Admit: 2019-11-05 | Discharge: 2019-11-05 | Disposition: A | Discharge status: Home / Self Care | Payer: BC Managed Care – PPO | Source: Ambulatory Visit | Attending: Physician Assistant | Admitting: Physician Assistant

## 2019-11-05 DIAGNOSIS — Z1231 Encounter for screening mammogram for malignant neoplasm of breast: Secondary | ICD-10-CM

## 2019-11-05 IMAGING — MG DIGITAL SCREENING BILAT W/ TOMO W/ CAD
8 series · 8 of 24 positions shown · non-contrast
Comparison: Previous exam(s).

ACR Breast Density Category a: The breast tissue is almost entirely
fatty.

CLINICAL DATA: Screening.

EXAM:
DIGITAL SCREENING BILATERAL MAMMOGRAM WITH TOMO AND CAD

[R MLO synth-2D]
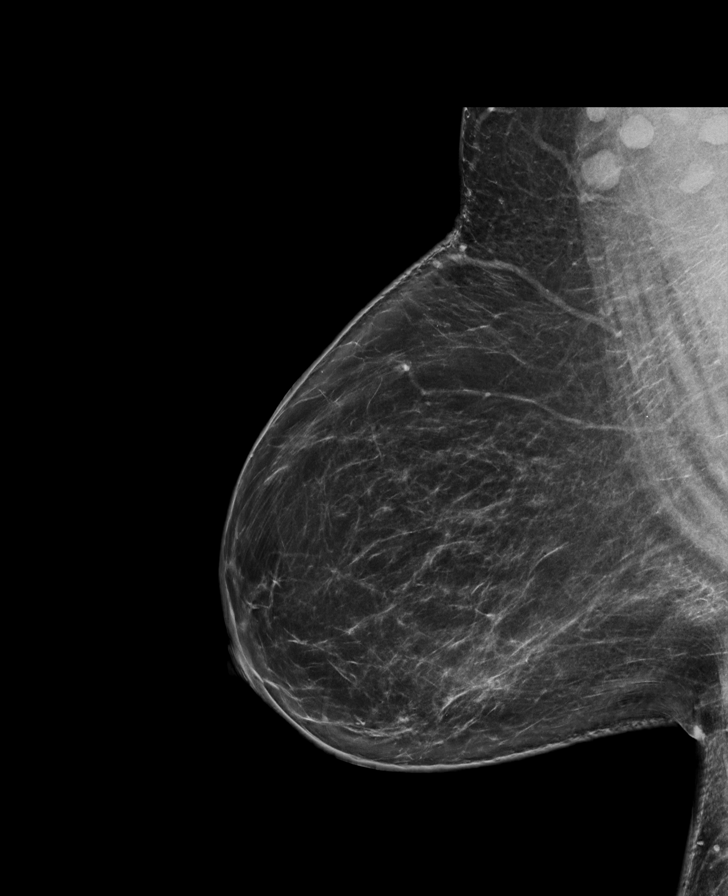

[R CC synth-2D]
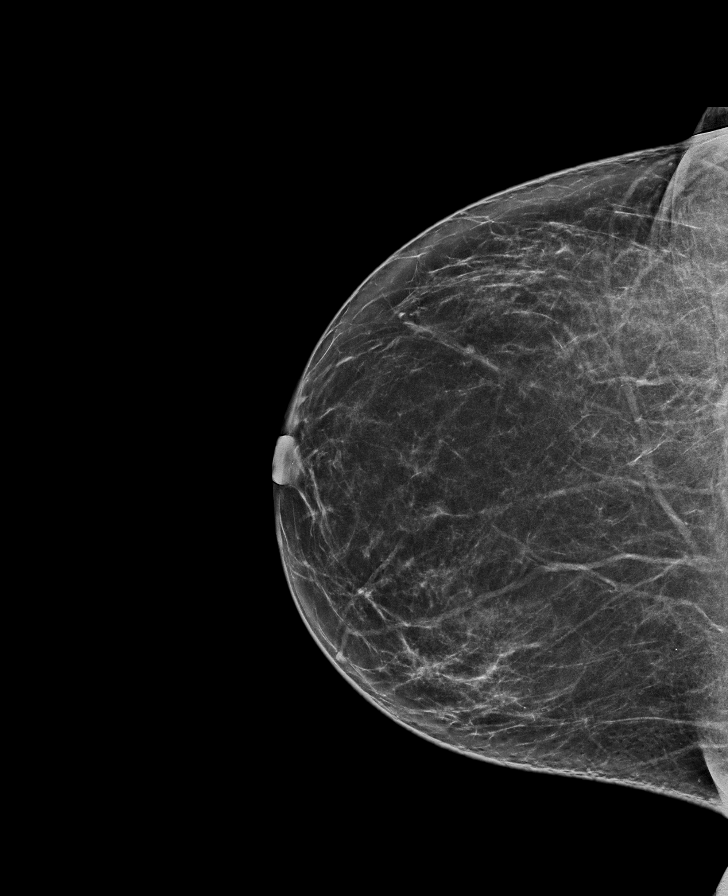

[L MLO synth-2D]
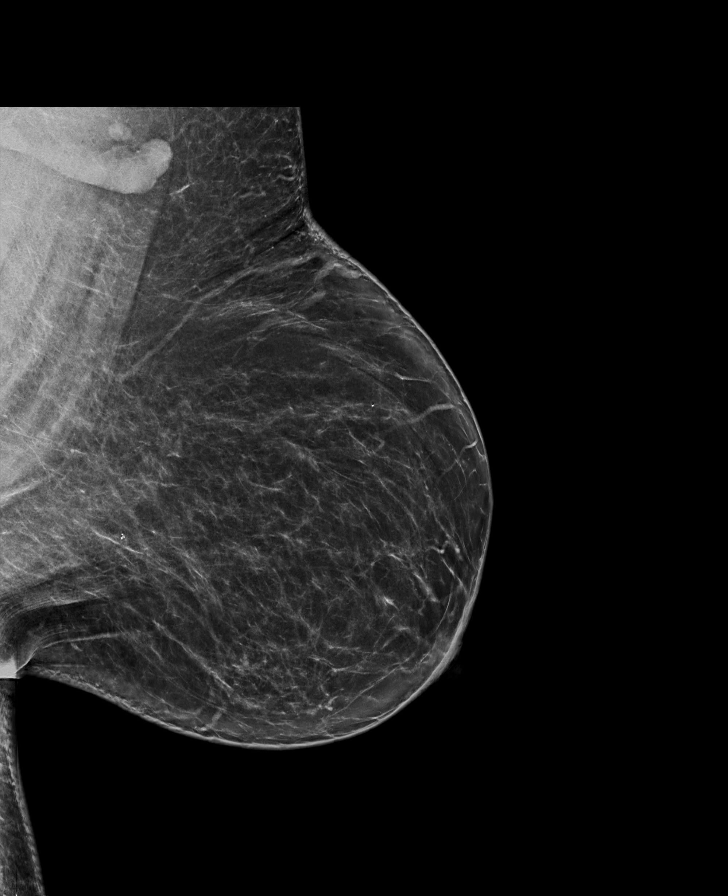

[L CC synth-2D]
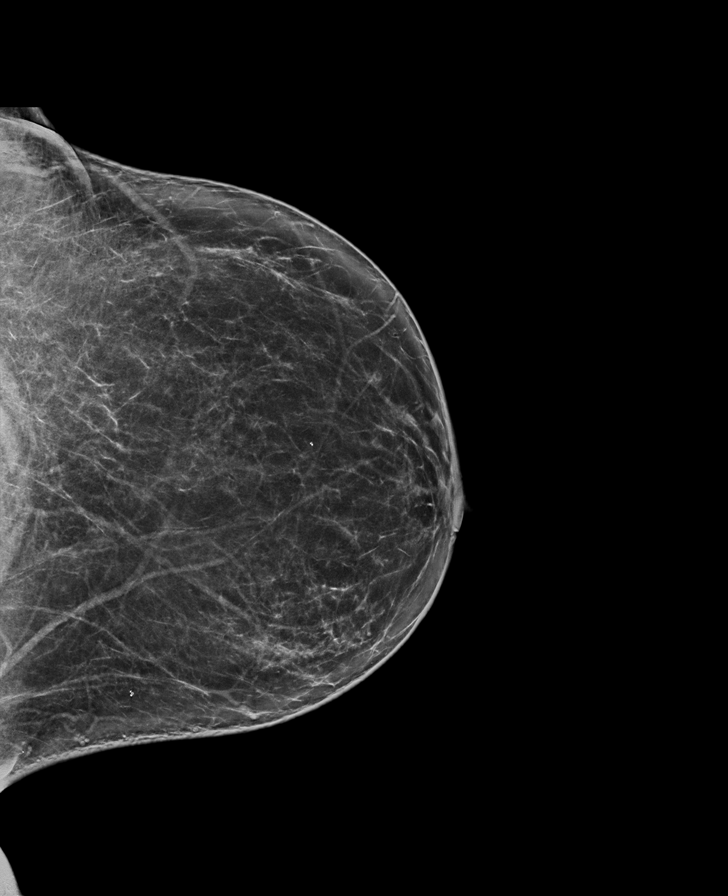

[L MLO tomo · tomo slice 45/88.0]
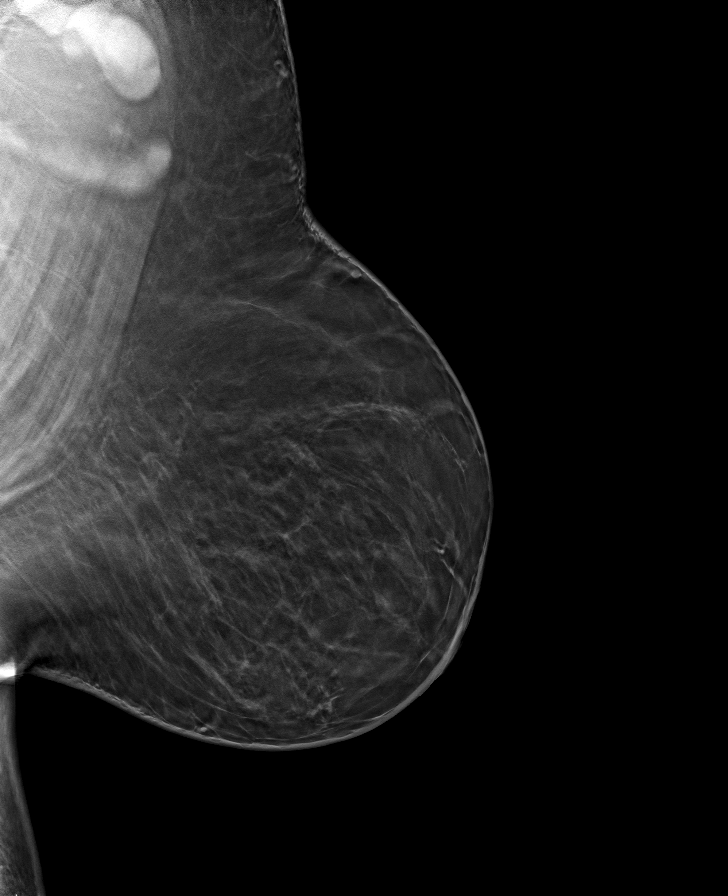

[L CC tomo · tomo slice 39/76.0]
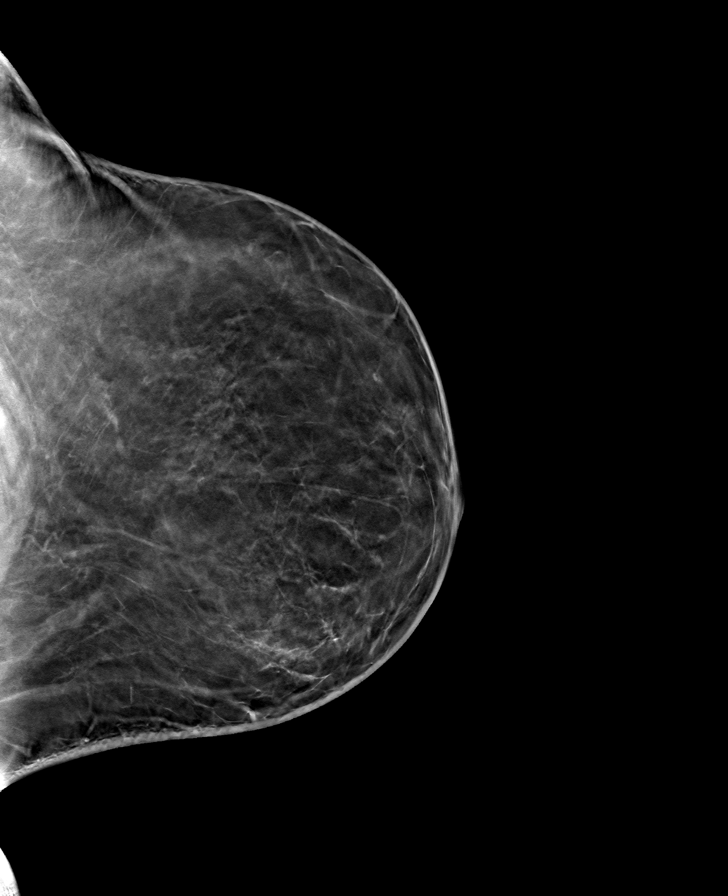

[R MLO tomo · tomo slice 45/89.0]
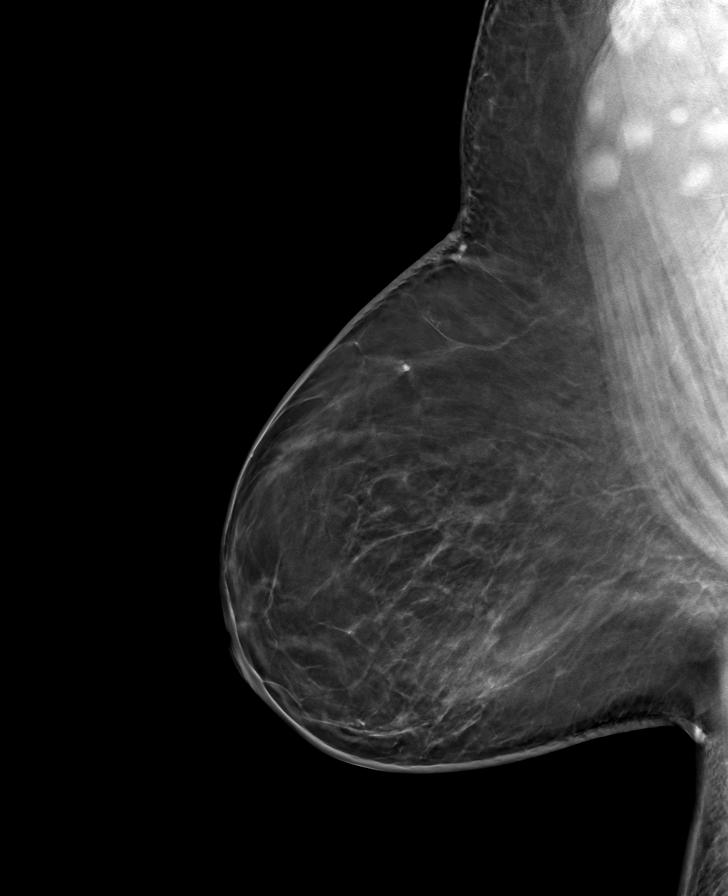

[R CC tomo · tomo slice 35/70.0]
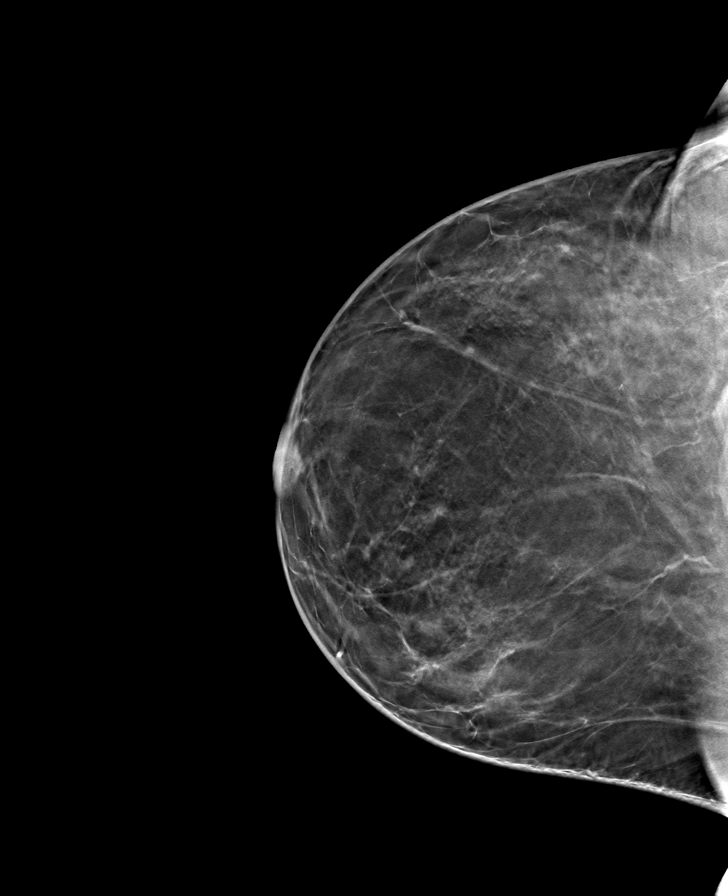

[8 of 24 positions shown; findings below may reference images not displayed]

FINDINGS: There are no findings suspicious for malignancy. Images were
processed with CAD.
IMPRESSION: No mammographic evidence of malignancy. A result letter of this
screening mammogram will be mailed directly to the patient.

RECOMMENDATION:
Screening mammogram in one year. (Code:[TA])

BI-RADS CATEGORY  1: Negative.

## 2020-03-25 ENCOUNTER — Ambulatory Visit: Payer: BC Managed Care – PPO | Attending: Family

## 2020-03-25 DIAGNOSIS — Z23 Encounter for immunization: Secondary | ICD-10-CM

## 2020-07-07 NOTE — Progress Notes (Signed)
   Covid-19 Vaccination Clinic  Name:  Destiny Andrews    MRN: [id number] DOB: July 21, 1970  07/07/2020  [name] was observed post Covid-19 immunization for 15 minutes without incident. She was provided with Vaccine Information Sheet and instruction to access the V-Safe system.   [name] was instructed to call 911 with any severe reactions post vaccine: Marland Kitchen Difficulty breathing  . Swelling of face and throat  . A fast heartbeat  . A bad rash all over body  . Dizziness and weakness   Immunizations Administered    Name Date Dose VIS Date Route   Moderna Covid-19 Booster Vaccine 03/25/2020  2:30 PM 0.25 mL 02/25/2020 Intramuscular   Manufacturer: Moderna   Lot: [id number]   [place]: 72072-182-88

## 2020-10-05 ENCOUNTER — Other Ambulatory Visit: Payer: Self-pay | Admitting: Physician Assistant

## 2020-10-05 DIAGNOSIS — Z1231 Encounter for screening mammogram for malignant neoplasm of breast: Secondary | ICD-10-CM

## 2020-11-11 ENCOUNTER — Ambulatory Visit
Admission: RE | Admit: 2020-11-11 | Discharge: 2020-11-11 | Disposition: A | Discharge status: Home / Self Care | Payer: BC Managed Care – PPO | Source: Ambulatory Visit | Attending: Physician Assistant | Admitting: Physician Assistant

## 2020-11-11 ENCOUNTER — Other Ambulatory Visit: Payer: Self-pay

## 2020-11-11 DIAGNOSIS — Z1231 Encounter for screening mammogram for malignant neoplasm of breast: Secondary | ICD-10-CM

## 2020-11-11 IMAGING — MG MM DIGITAL SCREENING BILAT W/ TOMO AND CAD
6 of 10 series · 6 of 30 positions shown · non-contrast
Comparison: Previous exam(s).

CLINICAL DATA: Screening.

EXAM:
DIGITAL SCREENING BILATERAL MAMMOGRAM WITH TOMOSYNTHESIS AND CAD
TECHNIQUE: Bilateral screening digital craniocaudal and mediolateral oblique
mammograms were obtained. Bilateral screening digital breast
tomosynthesis was performed. The images were evaluated with
computer-aided detection.

[R CC synth-2D (1 of 2)]
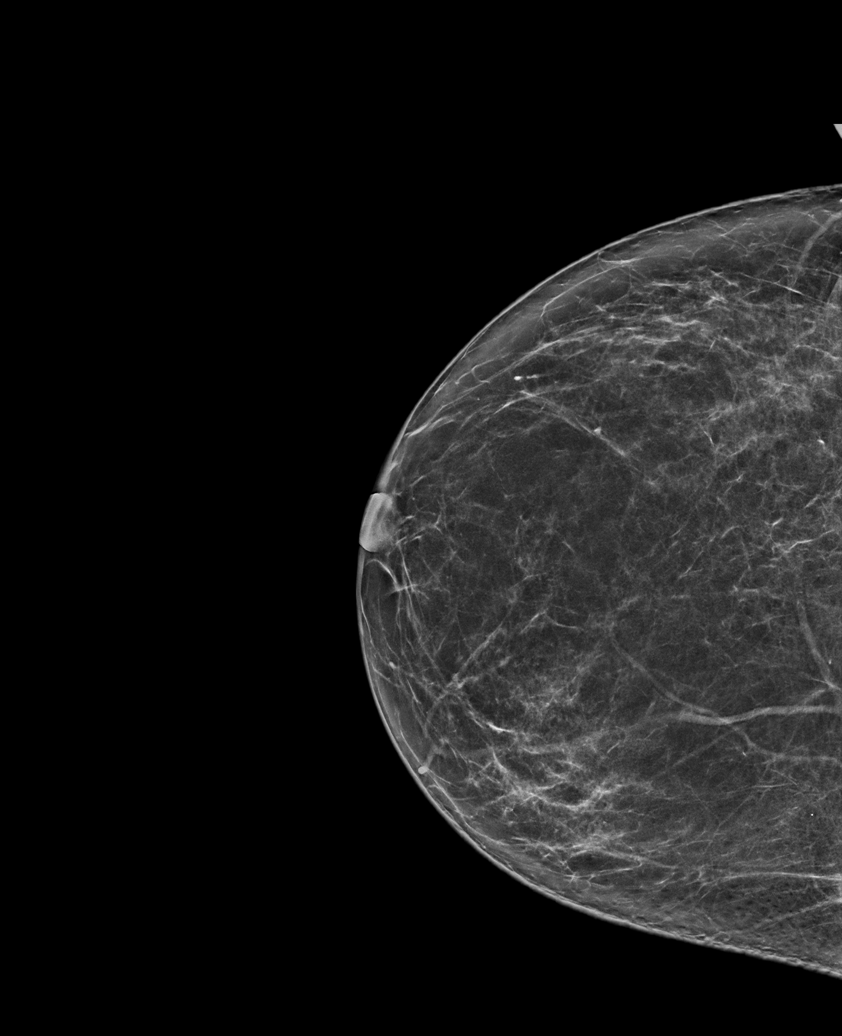

[R MLO synth-2D]
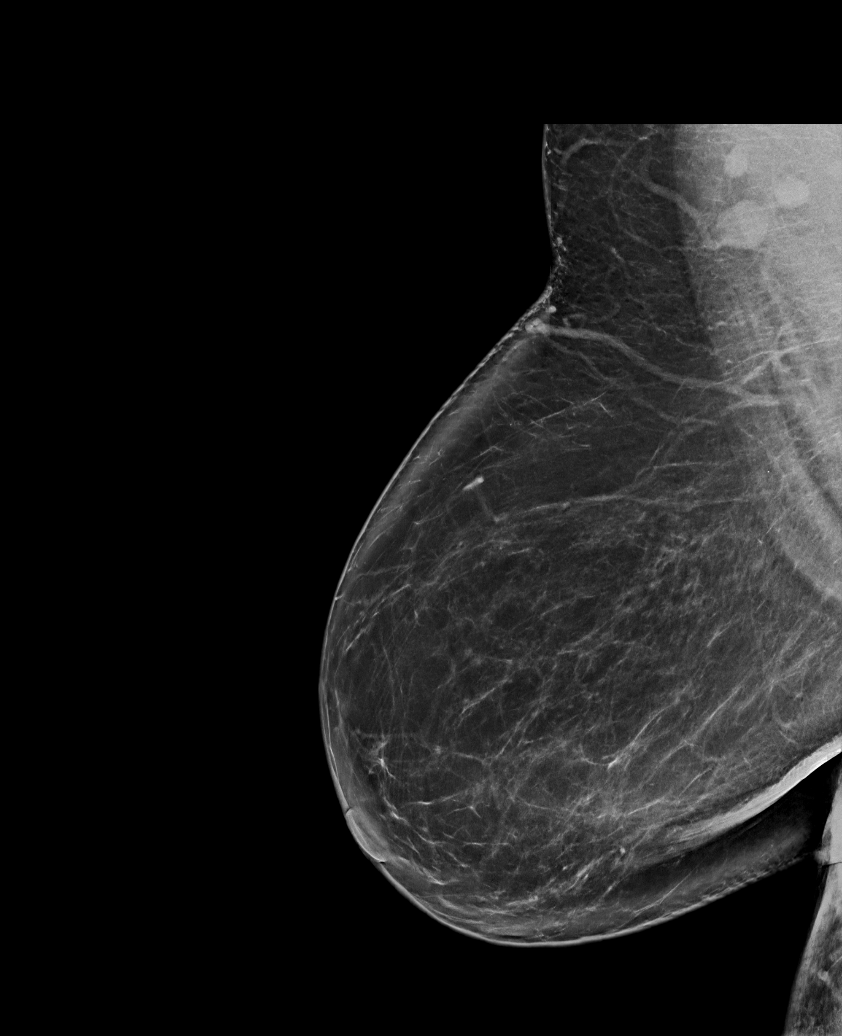

[L CC synth-2D]
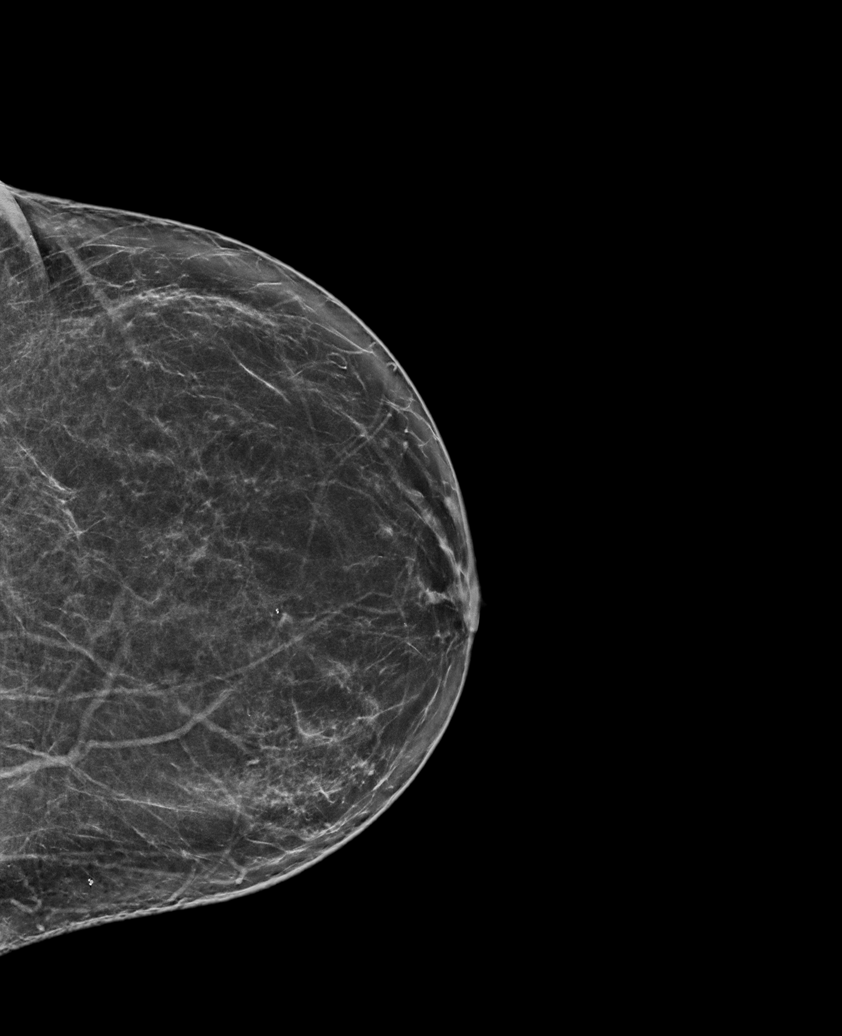

[L MLO synth-2D]
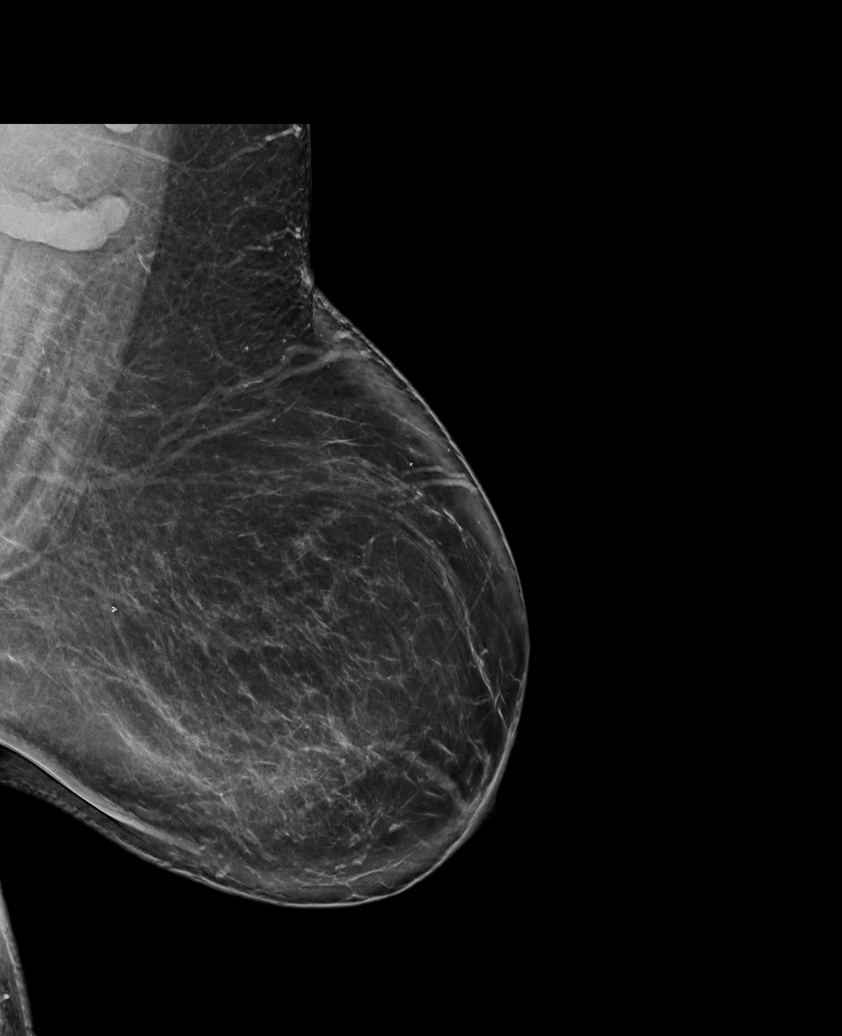

[R CC synth-2D (2 of 2)]
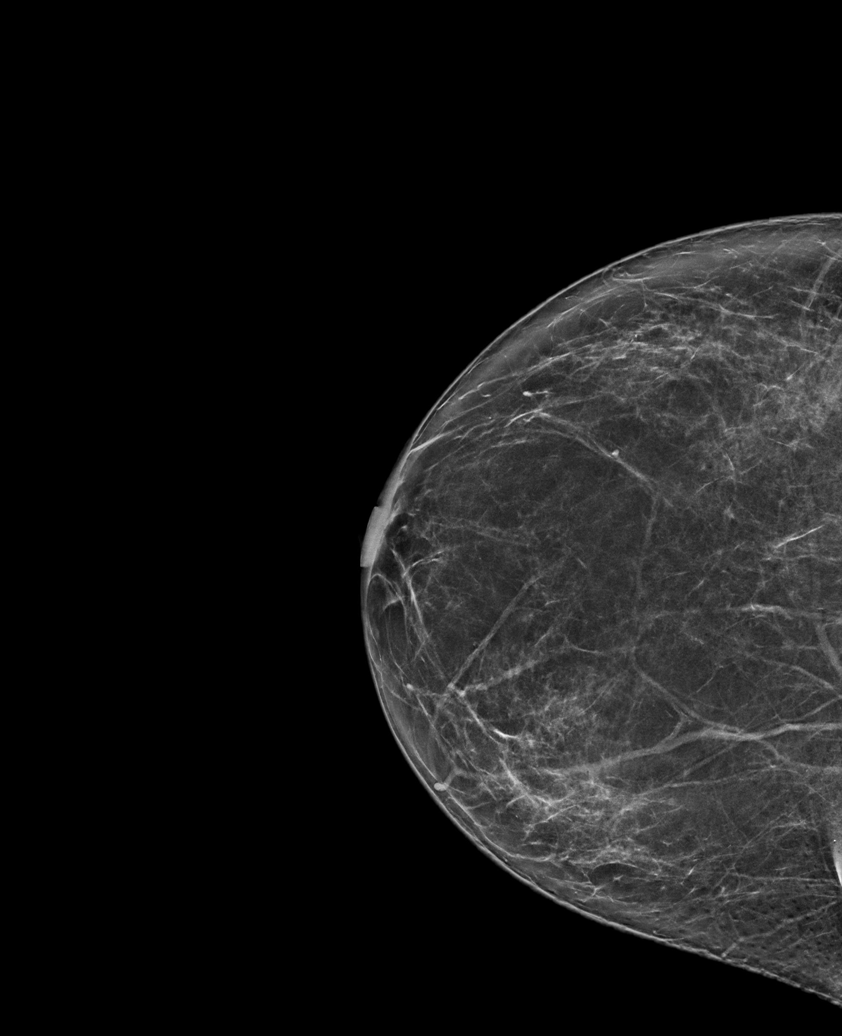

[L MLO tomo · tomo slice 49/98.0]
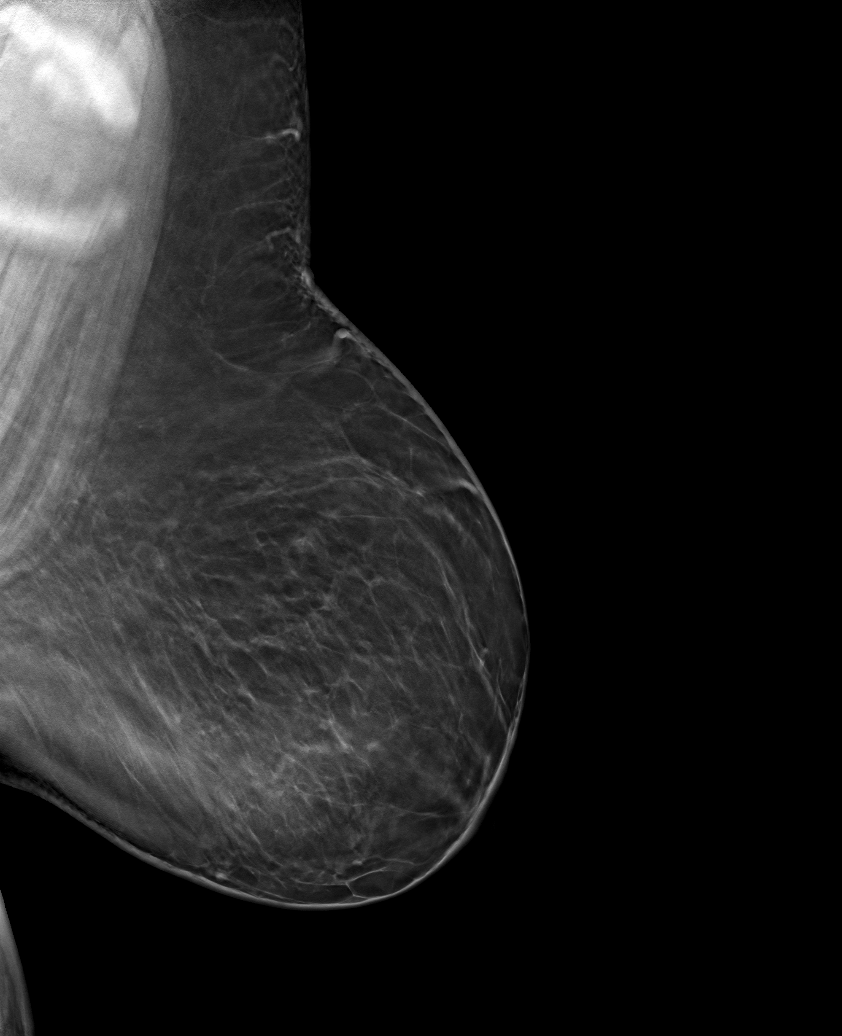

[6 of 30 positions shown; findings below may reference images not displayed]

ACR Breast Density Category b: There are scattered areas of
fibroglandular density.
FINDINGS: There are no findings suspicious for malignancy.
IMPRESSION: No mammographic evidence of malignancy. A result letter of this
screening mammogram will be mailed directly to the patient.

RECOMMENDATION:
Screening mammogram in one year. (Code:[BY])

BI-RADS CATEGORY  1: Negative.

## 2020-11-13 ENCOUNTER — Other Ambulatory Visit: Payer: Self-pay | Admitting: Student

## 2020-11-13 DIAGNOSIS — D251 Intramural leiomyoma of uterus: Secondary | ICD-10-CM

## 2020-11-13 DIAGNOSIS — D250 Submucous leiomyoma of uterus: Secondary | ICD-10-CM

## 2020-11-25 ENCOUNTER — Other Ambulatory Visit: Payer: BC Managed Care – PPO

## 2020-12-01 ENCOUNTER — Ambulatory Visit: Payer: BC Managed Care – PPO

## 2020-12-19 ENCOUNTER — Other Ambulatory Visit: Payer: Self-pay

## 2020-12-19 ENCOUNTER — Ambulatory Visit
Admission: RE | Admit: 2020-12-19 | Discharge: 2020-12-19 | Disposition: A | Discharge status: Home / Self Care | Payer: BC Managed Care – PPO | Source: Ambulatory Visit | Attending: Student | Admitting: Student

## 2020-12-19 DIAGNOSIS — D250 Submucous leiomyoma of uterus: Secondary | ICD-10-CM

## 2020-12-19 DIAGNOSIS — D251 Intramural leiomyoma of uterus: Secondary | ICD-10-CM

## 2020-12-19 IMAGING — MR MR PELVIS WO/W CM
12 of 17 series · 33 of 48 positions shown · IV contrast (multihance)
Comparison: [DATE]

CLINICAL DATA: Symptomatic fibroids.  Treatment planning.

EXAM:
MRI PELVIS WITHOUT AND WITH CONTRAST
TECHNIQUE: Multiplanar multisequence MR imaging of the pelvis was performed
both before and after administration of intravenous contrast.
CONTRAST:  20mL MULTIHANCE GADOBENATE DIMEGLUMINE 529 MG/ML IV SOLN

[Series 3: T2 · coronal · 6.0mm · 1.76mm/px · 1 of 20 slices shown (1 of 4)]
[im 1/20]
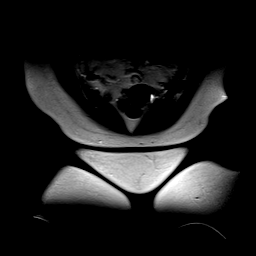

[Series 4: T2 · axial · 5.0mm · 1.17mm/px · z∈[-87,+129]mm · 2 of 37 slices shown (2 of 4)]
[im 1/37]
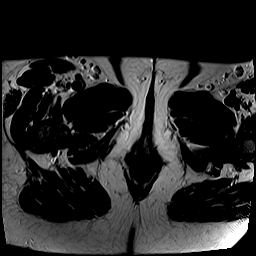
[im 37/37]
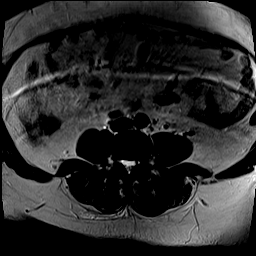

[Series 5: T2 fat-sat · axial · 5.0mm · 1.09mm/px · z∈[-87,+129]mm · 2 of 37 slices shown]
[im 1/37]
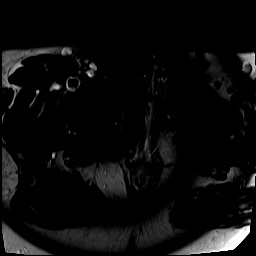
[im 37/37]
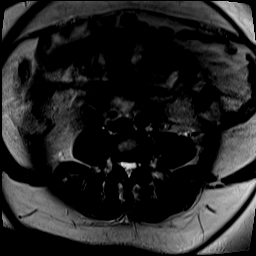

[Series 6: T2 · sagittal · 5.5mm · 1.02mm/px · 2 of 32 slices shown (3 of 4)]
[im 1/32]
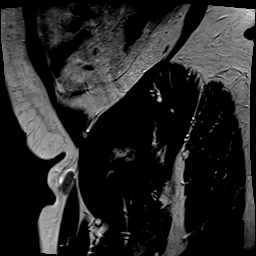
[im 32/32]
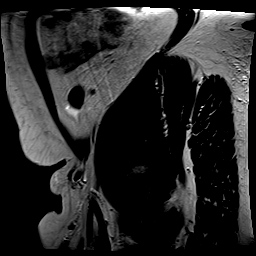

[Series 7: T2 · coronal · 3.5mm · 0.81mm/px · 3 of 58 slices shown (4 of 4)]
[im 1/58]
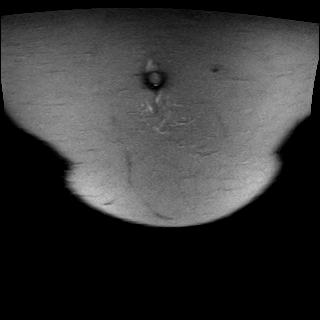
[im 29/58]
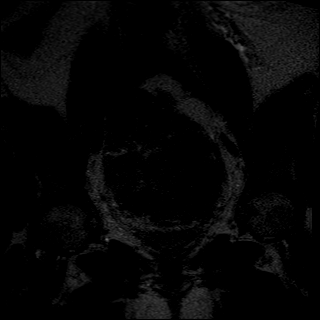
[im 58/58]
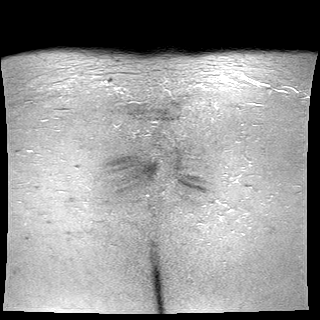

[Series 8: DWI · axial · 5.0mm · 1.70mm/px · z∈[-87,+128]mm · 7 of 111 slices shown]
[im 1/111]
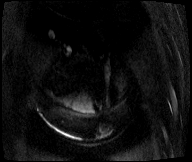
[im 19/111]
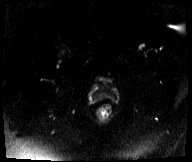
[im 37/111]
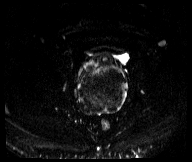
[im 56/111]
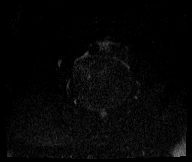
[im 74/111]
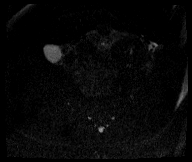
[im 92/111]
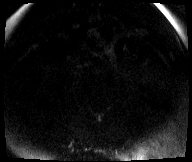
[im 111/111]
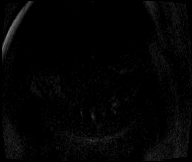

[Series 9: axial dwi_adc · axial · 5.0mm · 1.70mm/px · z∈[-87,+128]mm · 2 of 37 slices shown]
[im 1/37]
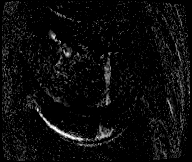
[im 37/37]
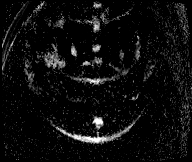

[Series 10: axial in out · axial · 5.0mm · 0.59mm/px · z∈[-85,+119]mm · 4 of 70 slices shown]
[im 1/70]
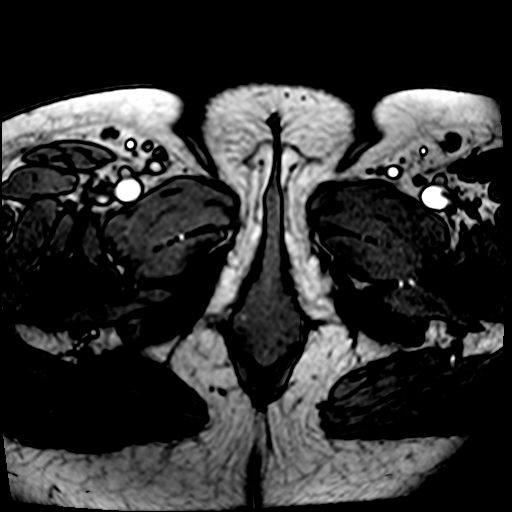
[im 24/70]
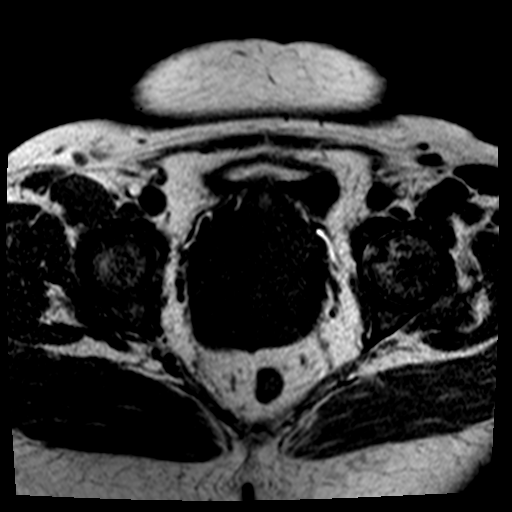
[im 47/70]
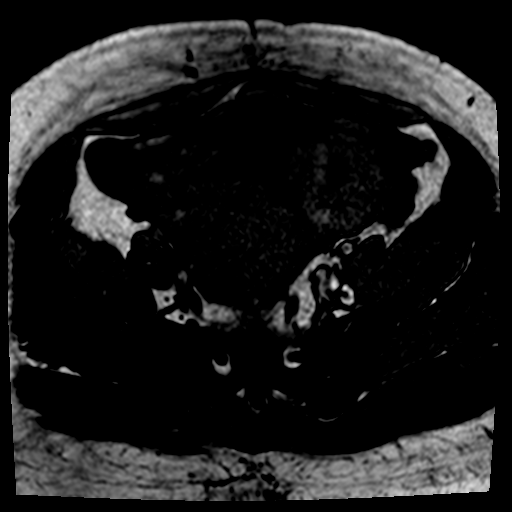
[im 70/70]
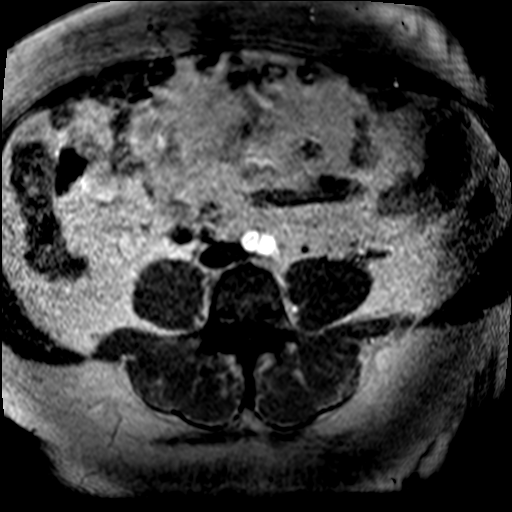

[Series 11: T1 dynamic · axial · non-contrast · 4.0mm · 0.59mm/px · z∈[-73,+115]mm · 3 of 48 slices shown (1 of 2)]
[im 1/48]
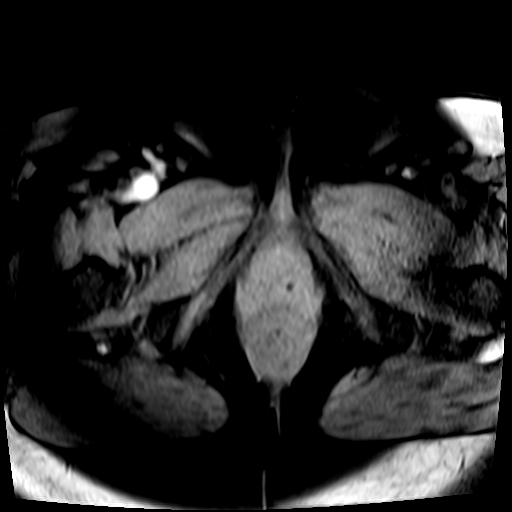
[im 24/48]
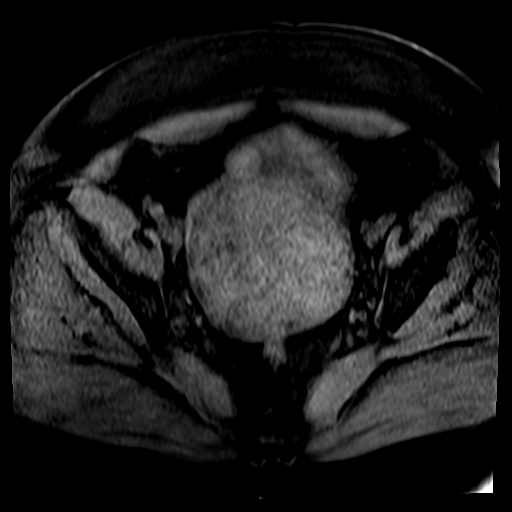
[im 48/48]
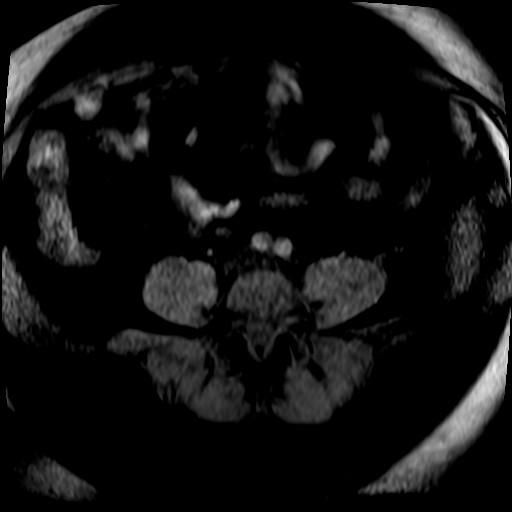

[Series 12: T1 dynamic post-contrast · axial · 4.0mm · 0.59mm/px · z∈[-73,+115]mm · 3 of 48 slices shown (1 of 2)]
[im 1/48]
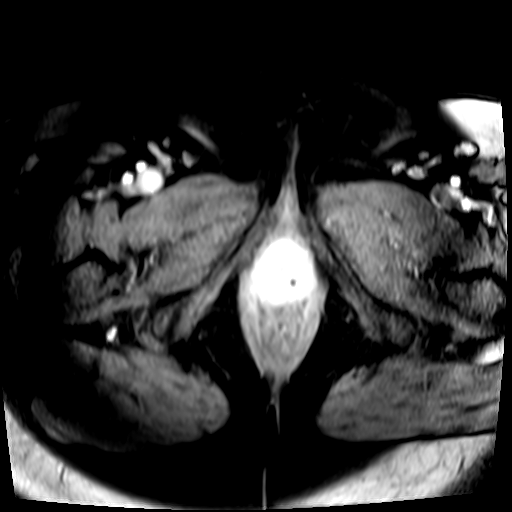
[im 24/48]
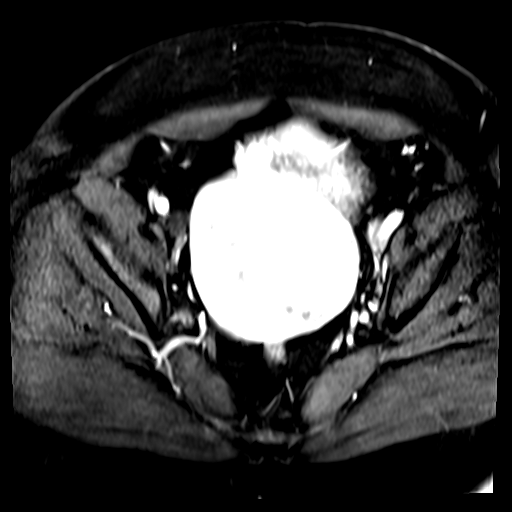
[im 48/48]
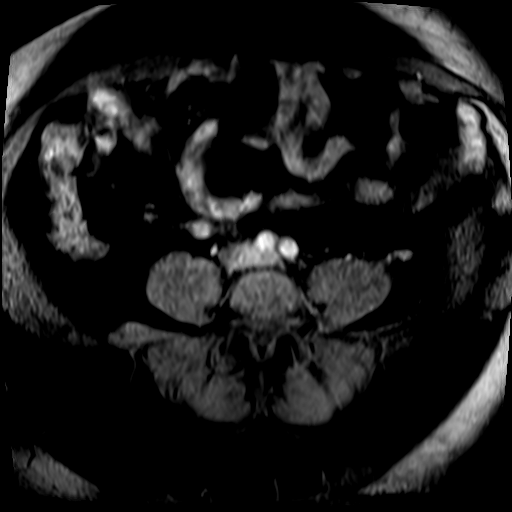

[Series 13: T1 dynamic · axial · 4.0mm · 0.59mm/px · z∈[-73,+115]mm · 3 of 48 slices shown (2 of 2)]
[im 1/48]
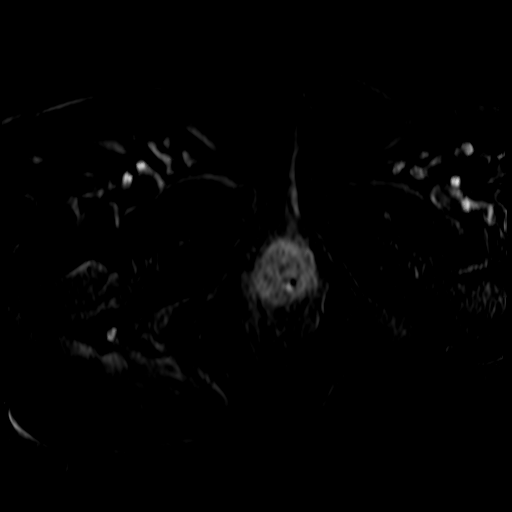
[im 24/48]
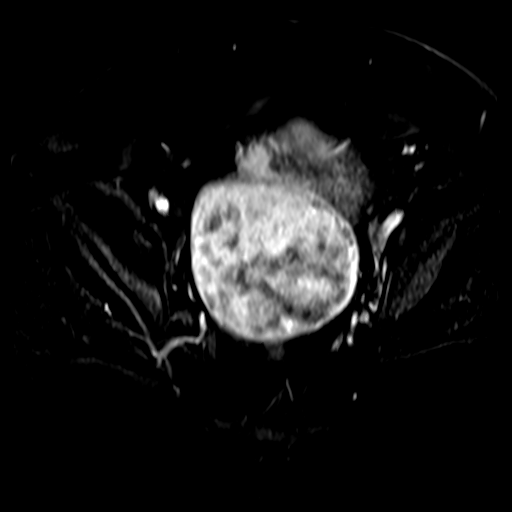
[im 48/48]
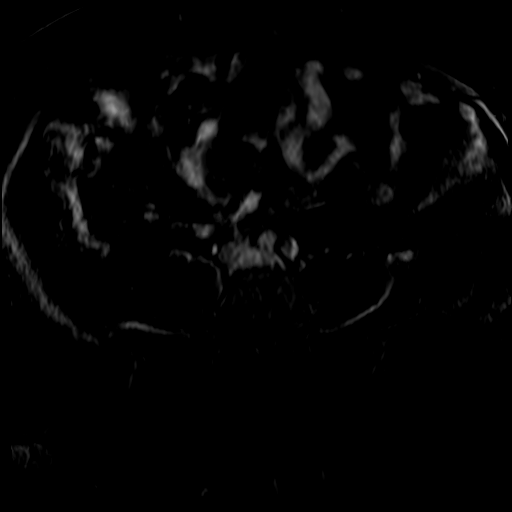

[Series 14: T1 dynamic post-contrast · axial · 4.0mm · 0.59mm/px · 1 of 48 slices shown (2 of 2)]
[im 1/48]
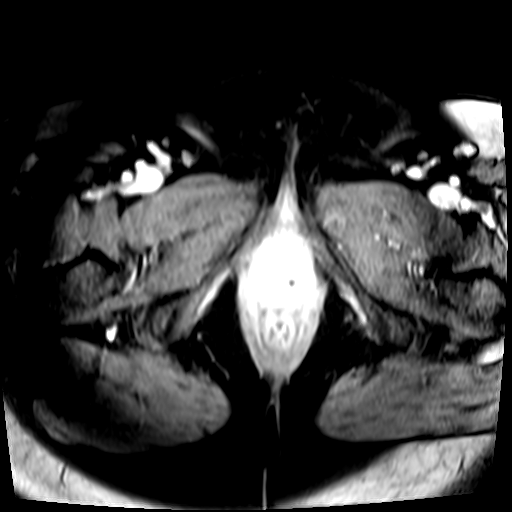

[33 of 48 positions shown; findings below may reference images not displayed]

FINDINGS: Lower Urinary Tract: No bladder or urethral abnormality identified.

Bowel:  Unremarkable visualized pelvic bowel loops.

Vascular/Lymphatic: No pathologically enlarged lymph nodes or other
significant abnormality.

Reproductive:

-- Uterus: Measures 20.0 x 9.7 by 15.2 cm (volume = [YE] cm^3).
Numerous intramural and subserosal fibroids are seen which involve
the uterus diffusely. The largest fibroid is seen in the posterior
uterine corpus measuring 15.2 cm in maximum diameter. Multiple other
smaller fibroids are seen ranging from 1 cm to 6.8 cm. Thin
endometrium is noted. Cervix and vagina are unremarkable in
appearance.

-- Intracavitary fibroids:  None.

-- Pedunculated fibroids: None.

-- Fibroid contrast enhancement: All fibroids show contrast
enhancement, without significant degeneration/devascularization.

-- Right ovary:  3.5 cm benign-appearing cyst is seen.

-- Left ovary:  Appears normal.  No mass identified.

Other: No abnormal free fluid.

Musculoskeletal:  Unremarkable.
IMPRESSION: Markedly enlarged uterus with diffuse involvement by fibroids,
largest measuring 15.2 cm. No intracavitary or pedunculated fibroids
identified.

3.5 cm benign-appearing right ovarian cyst. No follow-up imaging
recommended. Note: This recommendation does not apply to
premenarchal patients and to those with increased risk (genetic,
family history, elevated tumor markers or other high-risk factors)
of ovarian cancer. Reference: JACR [DATE]):[id number]

## 2020-12-19 MED ORDER — GADOBENATE DIMEGLUMINE 529 MG/ML IV SOLN
20.0000 mL | Freq: Once | INTRAVENOUS | Status: AC | PRN
  Administered 2020-12-19: 20 mL via INTRAVENOUS

## 2021-03-21 ENCOUNTER — Ambulatory Visit: Payer: BC Managed Care – PPO | Attending: Family

## 2021-03-21 DIAGNOSIS — Z23 Encounter for immunization: Secondary | ICD-10-CM

## 2021-03-21 NOTE — Progress Notes (Signed)
   Covid-19 Vaccination Clinic  Name:  Destiny Andrews    MRN: [id number] DOB: 20-Oct-1970  03/21/2021  Destiny Andrews was observed post Covid-19 immunization for 15 minutes without incident. She was provided with Vaccine Information Sheet and instruction to access the V-Safe system.   Destiny Andrews was instructed to call 911 with any severe reactions post vaccine: Difficulty breathing  Swelling of face and throat  A fast heartbeat  A bad rash all over body  Dizziness and weakness   Immunizations Administered     Name Date Dose VIS Date Route   Moderna Covid-19 vaccine Bivalent Booster 03/21/2021 10:30 AM 0.5 mL 12/18/2020 Intramuscular   Manufacturer: Moderna   Lot: [id number]   [place]: [id number]

## 2021-04-07 HISTORY — PX: LAPAROSCOPIC HYSTERECTOMY: SHX1926

## 2021-07-25 ENCOUNTER — Other Ambulatory Visit: Payer: Self-pay | Admitting: Physician Assistant

## 2021-07-25 DIAGNOSIS — Z1231 Encounter for screening mammogram for malignant neoplasm of breast: Secondary | ICD-10-CM

## 2021-10-11 ENCOUNTER — Ambulatory Visit (AMBULATORY_SURGERY_CENTER): Payer: BC Managed Care – PPO

## 2021-10-11 VITALS — Wt 258.0 lb

## 2021-10-11 DIAGNOSIS — Z1211 Encounter for screening for malignant neoplasm of colon: Secondary | ICD-10-CM

## 2021-10-11 MED ORDER — ONDANSETRON HCL 4 MG PO TABS
4.0000 mg | ORAL_TABLET | ORAL | 0 refills | Status: AC
Start: 2021-10-11 — End: ?

## 2021-10-11 MED ORDER — PEG 3350-KCL-NA BICARB-NACL 420 G PO SOLR
4000.0000 mL | Freq: Once | ORAL | 0 refills | Status: AC
Start: 2021-10-11 — End: 2021-10-11

## 2021-10-11 NOTE — Progress Notes (Signed)
No egg or soy allergy known to patient  No issues known to pt with past sedation with any surgeries or procedures Patient denies ever being told they had issues or difficulty with intubation  No FH of Malignant Hyperthermia Pt is not on diet pills Pt is not on  home 02  Pt is not on blood thinners  Pt denies issues with constipation  No A fib or A flutter   NO PA's for preps discussed with pt In PV today  Discussed with pt there will be an out-of-pocket cost for prep and that varies from $0 to 70 +  dollars - pt verbalized understanding  Pt instructed to use Singlecare.com or GoodRx for a price reduction on prep   PV completed over the phone. Pt verified name, DOB, address and insurance during PV today.  Pt mailed instruction packet with copy of consent form to read and not return, and instructions.  Pt encouraged to call with questions or issues.  If pt has My chart, procedure instructions also sent via My Chart  Insurance confirmed with pt at [facility] today

## 2021-10-25 ENCOUNTER — Encounter: Payer: Self-pay | Admitting: Gastroenterology

## 2021-10-28 ENCOUNTER — Ambulatory Visit (AMBULATORY_SURGERY_CENTER): Payer: BC Managed Care – PPO | Admitting: Gastroenterology

## 2021-10-28 ENCOUNTER — Encounter: Payer: Self-pay | Admitting: Gastroenterology

## 2021-10-28 VITALS — BP 138/77 | HR 77 | Temp 98.2°F | Resp 13 | Ht 66.5 in | Wt 258.0 lb

## 2021-10-28 DIAGNOSIS — Z1211 Encounter for screening for malignant neoplasm of colon: Secondary | ICD-10-CM | POA: Diagnosis present

## 2021-10-28 MED ORDER — SODIUM CHLORIDE 0.9 % IV SOLN
500.0000 mL | Freq: Once | INTRAVENOUS | Status: DC
Start: 2021-10-28 — End: 2021-10-28

## 2021-10-28 NOTE — Progress Notes (Signed)
   Referring Provider: Milus Height, PA Primary Care Physician:  Milus Height, PA  Indication for Procedure:  Colon cancer screening   IMPRESSION:  Need for colon cancer screening Appropriate candidate for monitored anesthesia care  PLAN: Colonoscopy in the [facility] today   HPI: Destiny Andrews is a 51 y.o. female presents for screening colonoscopy.  No prior colonoscopy or colon cancer screening.  No baseline GI symptoms.   No known family history of colon cancer or polyps. No family history of uterine/endometrial cancer, pancreatic cancer or gastric/stomach cancer.   Past Medical History:  Diagnosis Date   Diabetes mellitus without complication (HCC) 2019   Hypertension 2019    Past Surgical History:  Procedure Laterality Date   CHOLECYSTECTOMY  1992   IR RADIOLOGIST EVAL & MGMT  08/05/2019   IR RADIOLOGIST EVAL & MGMT  10/23/2019   KNEE CARTILAGE SURGERY Left 2011   LAPAROSCOPIC HYSTERECTOMY  04/2021   uterus only    Current Outpatient Medications  Medication Sig Dispense Refill   acetaminophen (TYLENOL) 325 MG tablet Take by mouth.     atorvastatin (LIPITOR) 10 MG tablet Take 10 mg by mouth daily.     benazepril-hydrochlorthiazide (LOTENSIN HCT) 10-12.5 MG tablet Take 1 tablet by mouth daily.     Biotin 3 MG TABS Take by mouth.     ibuprofen (ADVIL) 600 MG tablet Take 600 mg by mouth every 6 (six) hours as needed.     metFORMIN (GLUCOPHAGE) 500 MG tablet Take 250 mg by mouth 2 (two) times daily.     norethindrone-ethinyl estradiol (CYCLAFEM,ALYACEN) 0.5/0.75/1-35 MG-MCG tablet Take 1 tablet by mouth daily.     ondansetron (ZOFRAN) 4 MG tablet Take 1 tablet (4 mg total) by mouth as directed. Take one Zofran 4 mg tablet 30-60 minutes before each prep dose 2 tablet 0   valsartan (DIOVAN) 80 MG tablet Take 80 mg by mouth daily.     Vitamin D, Ergocalciferol, (DRISDOL) 1.25 MG (50000 UNIT) CAPS capsule Take by mouth.     No current facility-administered  medications for this visit.    Allergies as of 10/28/2021 - Review Complete 10/11/2021  Allergen Reaction Noted   Lisinopril Swelling 02/16/2018    Family History  Problem Relation Age of Onset   Colon cancer Neg Hx    Colon polyps Neg Hx    Esophageal cancer Neg Hx    Rectal cancer Neg Hx    Stomach cancer Neg Hx      Physical Exam: General:   Alert,  well-nourished, pleasant and cooperative in NAD Head:  Normocephalic and atraumatic. Eyes:  Sclera clear, no icterus.   Conjunctiva pink. Mouth:  No deformity or lesions.   Neck:  Supple; no masses or thyromegaly. Lungs:  Clear throughout to auscultation.   No wheezes. Heart:  Regular rate and rhythm; no murmurs. Abdomen:  Soft, non-tender, nondistended, normal bowel sounds, no rebound or guarding.  Msk:  Symmetrical. No boney deformities LAD: No inguinal or umbilical LAD Extremities:  No clubbing or edema. Neurologic:  Alert and  oriented x4;  grossly nonfocal Skin:  No obvious rash or bruise. Psych:  Alert and cooperative. Normal mood and affect.     Studies/Results: No results found.    Tip Atienza L. Orvan Falconer, MD, MPH 10/28/2021, 10:55 AM

## 2021-10-28 NOTE — Progress Notes (Signed)
To pacu, VSS. Report to [email or web address] 

## 2021-10-31 ENCOUNTER — Telehealth: Payer: Self-pay

## 2021-10-31 NOTE — Telephone Encounter (Signed)
  Follow up Call-     10/28/2021   11:00 AM  Call back number  Post procedure Call Back phone  # [phone]  Permission to leave phone message Yes     Patient questions:  Do you have a fever, pain , or abdominal swelling? No. Pain Score  0 *  Have you tolerated food without any problems? Yes.    Have you been able to return to your normal activities? Yes.    Do you have any questions about your discharge instructions: Diet   No. Medications  No. Follow up visit  No.  Do you have questions or concerns about your Care? No.  Actions: * If pain score is 4 or above: No action needed, pain <4.

## 2021-11-14 ENCOUNTER — Ambulatory Visit
Admission: RE | Admit: 2021-11-14 | Discharge: 2021-11-14 | Disposition: A | Discharge status: Home / Self Care | Payer: BC Managed Care – PPO | Source: Ambulatory Visit | Attending: Physician Assistant | Admitting: Physician Assistant

## 2021-11-14 DIAGNOSIS — Z1231 Encounter for screening mammogram for malignant neoplasm of breast: Secondary | ICD-10-CM

## 2021-11-15 ENCOUNTER — Other Ambulatory Visit: Payer: Self-pay | Admitting: Physician Assistant

## 2021-11-15 DIAGNOSIS — R928 Other abnormal and inconclusive findings on diagnostic imaging of breast: Secondary | ICD-10-CM

## 2021-12-06 ENCOUNTER — Other Ambulatory Visit: Payer: Self-pay | Admitting: Physician Assistant

## 2021-12-06 ENCOUNTER — Ambulatory Visit
Admission: RE | Admit: 2021-12-06 | Discharge: 2021-12-06 | Disposition: A | Discharge status: Home / Self Care | Payer: BC Managed Care – PPO | Source: Ambulatory Visit | Attending: Physician Assistant | Admitting: Physician Assistant

## 2021-12-06 DIAGNOSIS — R928 Other abnormal and inconclusive findings on diagnostic imaging of breast: Secondary | ICD-10-CM

## 2021-12-06 DIAGNOSIS — N6320 Unspecified lump in the left breast, unspecified quadrant: Secondary | ICD-10-CM

## 2022-06-09 ENCOUNTER — Ambulatory Visit
Admission: RE | Admit: 2022-06-09 | Discharge: 2022-06-09 | Disposition: A | Discharge status: Home / Self Care | Payer: BC Managed Care – PPO | Source: Ambulatory Visit | Attending: Physician Assistant | Admitting: Physician Assistant

## 2022-06-09 DIAGNOSIS — N6320 Unspecified lump in the left breast, unspecified quadrant: Secondary | ICD-10-CM

## 2022-06-12 ENCOUNTER — Other Ambulatory Visit: Payer: Self-pay | Admitting: Physician Assistant

## 2022-06-12 DIAGNOSIS — N6320 Unspecified lump in the left breast, unspecified quadrant: Secondary | ICD-10-CM

## 2022-12-11 ENCOUNTER — Ambulatory Visit
Admission: RE | Admit: 2022-12-11 | Discharge: 2022-12-11 | Disposition: A | Discharge status: Home / Self Care | Payer: BC Managed Care – PPO | Source: Ambulatory Visit | Attending: Physician Assistant | Admitting: Physician Assistant

## 2022-12-11 ENCOUNTER — Ambulatory Visit: Payer: BC Managed Care – PPO

## 2022-12-11 DIAGNOSIS — N6320 Unspecified lump in the left breast, unspecified quadrant: Secondary | ICD-10-CM

## 2023-11-12 ENCOUNTER — Other Ambulatory Visit: Payer: Self-pay | Admitting: Physician Assistant

## 2023-11-12 DIAGNOSIS — N63 Unspecified lump in unspecified breast: Secondary | ICD-10-CM

## 2023-12-17 ENCOUNTER — Ambulatory Visit
Admission: RE | Admit: 2023-12-17 | Discharge: 2023-12-17 | Disposition: A | Discharge status: Home / Self Care | Payer: Self-pay | Source: Ambulatory Visit | Attending: Physician Assistant | Admitting: Physician Assistant

## 2023-12-17 DIAGNOSIS — N63 Unspecified lump in unspecified breast: Secondary | ICD-10-CM
